# Patient Record
Sex: Female | Born: 1975 | Hispanic: Yes | Marital: Married | Smoking: Never smoker
Health system: Southern US, Community
[De-identification: ages and names within clinical notes are randomized; demographics above are authoritative.]

## PROBLEM LIST (undated history)

## (undated) DIAGNOSIS — H209 Unspecified iridocyclitis: Secondary | ICD-10-CM

## (undated) DIAGNOSIS — E785 Hyperlipidemia, unspecified: Secondary | ICD-10-CM

## (undated) DIAGNOSIS — E079 Disorder of thyroid, unspecified: Secondary | ICD-10-CM

## (undated) HISTORY — DX: Hyperlipidemia, unspecified: E78.5

## (undated) HISTORY — DX: Unspecified iridocyclitis: H20.9

---

## 2001-01-16 ENCOUNTER — Encounter (INDEPENDENT_AMBULATORY_CARE_PROVIDER_SITE_OTHER): Payer: Self-pay | Admitting: *Deleted

## 2001-01-16 LAB — CONVERTED CEMR LAB

## 2001-01-21 ENCOUNTER — Encounter: Admission: RE | Admit: 2001-01-21 | Discharge: 2001-01-21 | Payer: Self-pay | Admitting: Family Medicine

## 2002-09-05 ENCOUNTER — Emergency Department (HOSPITAL_COMMUNITY): Admission: EM | Admit: 2002-09-05 | Discharge: 2002-09-05 | Payer: Self-pay | Admitting: Emergency Medicine

## 2002-09-16 ENCOUNTER — Encounter: Payer: Self-pay | Admitting: Internal Medicine

## 2002-09-16 ENCOUNTER — Ambulatory Visit (HOSPITAL_COMMUNITY): Admission: RE | Admit: 2002-09-16 | Discharge: 2002-09-16 | Payer: Self-pay | Admitting: Internal Medicine

## 2003-01-01 ENCOUNTER — Encounter: Admission: RE | Admit: 2003-01-01 | Discharge: 2003-01-01 | Payer: Self-pay | Admitting: Family Medicine

## 2003-05-21 ENCOUNTER — Encounter: Admission: RE | Admit: 2003-05-21 | Discharge: 2003-05-21 | Payer: Self-pay | Admitting: Family Medicine

## 2004-08-25 ENCOUNTER — Other Ambulatory Visit: Admission: RE | Admit: 2004-08-25 | Discharge: 2004-08-25 | Payer: Self-pay | Admitting: Family Medicine

## 2005-01-06 ENCOUNTER — Encounter: Admission: RE | Admit: 2005-01-06 | Discharge: 2005-01-06 | Payer: Self-pay | Admitting: Family Medicine

## 2006-02-13 ENCOUNTER — Ambulatory Visit: Payer: Self-pay | Admitting: Internal Medicine

## 2006-02-13 LAB — CONVERTED CEMR LAB
ALT: 21 units/L (ref 0–40)
AST: 20 units/L (ref 0–37)
Albumin: 4 g/dL (ref 3.5–5.2)
Alkaline Phosphatase: 49 units/L (ref 39–117)
BUN: 18 mg/dL (ref 6–23)
Basophils Absolute: 0 10*3/uL (ref 0.0–0.1)
Basophils Relative: 0.2 % (ref 0.0–1.0)
Bilirubin, Direct: 0.1 mg/dL (ref 0.0–0.3)
CO2: 26 meq/L (ref 19–32)
Calcium: 9.2 mg/dL (ref 8.4–10.5)
Chloride: 102 meq/L (ref 96–112)
Creatinine, Ser: 0.6 mg/dL (ref 0.4–1.2)
Eosinophils Absolute: 0.1 10*3/uL (ref 0.0–0.6)
Eosinophils Relative: 1.5 % (ref 0.0–5.0)
GFR calc Af Amer: 151 mL/min
GFR calc non Af Amer: 125 mL/min
Glucose, Bld: 91 mg/dL (ref 70–99)
HCT: 41.6 % (ref 36.0–46.0)
Hemoglobin: 14.5 g/dL (ref 12.0–15.0)
Lymphocytes Relative: 25.4 % (ref 12.0–46.0)
MCHC: 34.8 g/dL (ref 30.0–36.0)
MCV: 92.5 fL (ref 78.0–100.0)
Monocytes Absolute: 0.2 10*3/uL (ref 0.2–0.7)
Monocytes Relative: 2.4 % — ABNORMAL LOW (ref 3.0–11.0)
Neutro Abs: 5.5 10*3/uL (ref 1.4–7.7)
Neutrophils Relative %: 70.5 % (ref 43.0–77.0)
Platelets: 249 10*3/uL (ref 150–400)
Potassium: 3.6 meq/L (ref 3.5–5.1)
RBC: 4.49 M/uL (ref 3.87–5.11)
RDW: 12 % (ref 11.5–14.6)
Sodium: 136 meq/L (ref 135–145)
Total Bilirubin: 0.8 mg/dL (ref 0.3–1.2)
Total Protein: 7.6 g/dL (ref 6.0–8.3)
WBC: 7.8 10*3/uL (ref 4.5–10.5)

## 2006-03-16 ENCOUNTER — Encounter (INDEPENDENT_AMBULATORY_CARE_PROVIDER_SITE_OTHER): Payer: Self-pay | Admitting: *Deleted

## 2006-11-21 ENCOUNTER — Other Ambulatory Visit: Admission: RE | Admit: 2006-11-21 | Discharge: 2006-11-21 | Payer: Self-pay | Admitting: Gynecology

## 2009-04-09 ENCOUNTER — Inpatient Hospital Stay (HOSPITAL_COMMUNITY): Admission: AD | Admit: 2009-04-09 | Discharge: 2009-04-09 | Payer: Self-pay | Admitting: Obstetrics

## 2009-05-15 ENCOUNTER — Inpatient Hospital Stay (HOSPITAL_COMMUNITY): Admission: AD | Admit: 2009-05-15 | Discharge: 2009-05-16 | Payer: Self-pay | Admitting: Obstetrics

## 2009-05-15 ENCOUNTER — Ambulatory Visit: Payer: Self-pay | Admitting: Advanced Practice Midwife

## 2009-06-18 ENCOUNTER — Inpatient Hospital Stay (HOSPITAL_COMMUNITY): Admission: AD | Admit: 2009-06-18 | Discharge: 2009-06-18 | Payer: Self-pay | Admitting: Obstetrics

## 2009-06-19 ENCOUNTER — Inpatient Hospital Stay (HOSPITAL_COMMUNITY): Admission: AD | Admit: 2009-06-19 | Discharge: 2009-06-23 | Payer: Self-pay | Admitting: Obstetrics

## 2010-04-04 LAB — URINE MICROSCOPIC-ADD ON

## 2010-04-04 LAB — CBC
HCT: 24.3 % — ABNORMAL LOW (ref 36.0–46.0)
HCT: 34.9 % — ABNORMAL LOW (ref 36.0–46.0)
Hemoglobin: 12.2 g/dL (ref 12.0–15.0)
Hemoglobin: 8.6 g/dL — ABNORMAL LOW (ref 12.0–15.0)
MCHC: 34.8 g/dL (ref 30.0–36.0)
MCHC: 35.3 g/dL (ref 30.0–36.0)
MCV: 96.5 fL (ref 78.0–100.0)
MCV: 97.5 fL (ref 78.0–100.0)
Platelets: 150 10*3/uL (ref 150–400)
Platelets: 202 10*3/uL (ref 150–400)
RBC: 2.49 MIL/uL — ABNORMAL LOW (ref 3.87–5.11)
RBC: 3.62 MIL/uL — ABNORMAL LOW (ref 3.87–5.11)
RDW: 14.8 % (ref 11.5–15.5)
RDW: 14.8 % (ref 11.5–15.5)
WBC: 18.7 10*3/uL — ABNORMAL HIGH (ref 4.0–10.5)
WBC: 9.9 10*3/uL (ref 4.0–10.5)

## 2010-04-04 LAB — URINALYSIS, ROUTINE W REFLEX MICROSCOPIC
Bilirubin Urine: NEGATIVE
Glucose, UA: NEGATIVE mg/dL
Ketones, ur: NEGATIVE mg/dL
Nitrite: NEGATIVE
Protein, ur: NEGATIVE mg/dL
Specific Gravity, Urine: <1.005 — ABNORMAL LOW (ref 1.005–1.030)
Urobilinogen, UA: 0.2 mg/dL (ref 0.0–1.0)
pH: 6 (ref 5.0–8.0)

## 2010-04-04 LAB — RPR: RPR Ser Ql: NONREACTIVE

## 2010-04-11 LAB — WET PREP, GENITAL
Trich, Wet Prep: NONE SEEN
Yeast Wet Prep HPF POC: NONE SEEN

## 2010-04-11 LAB — URINALYSIS, ROUTINE W REFLEX MICROSCOPIC
Bilirubin Urine: NEGATIVE
Glucose, UA: NEGATIVE mg/dL
Ketones, ur: NEGATIVE mg/dL
Leukocytes, UA: NEGATIVE
Nitrite: NEGATIVE
Protein, ur: NEGATIVE mg/dL
Specific Gravity, Urine: <1.005 — ABNORMAL LOW (ref 1.005–1.030)
Urobilinogen, UA: 0.2 mg/dL (ref 0.0–1.0)
pH: 6.5 (ref 5.0–8.0)

## 2010-04-11 LAB — URINE MICROSCOPIC-ADD ON

## 2011-06-16 ENCOUNTER — Encounter: Payer: Self-pay | Admitting: *Deleted

## 2011-06-16 ENCOUNTER — Ambulatory Visit (INDEPENDENT_AMBULATORY_CARE_PROVIDER_SITE_OTHER): Payer: Self-pay | Admitting: *Deleted

## 2011-06-16 ENCOUNTER — Other Ambulatory Visit: Payer: Self-pay | Admitting: Obstetrics and Gynecology

## 2011-06-16 VITALS — BP 112/74 | HR 63 | Temp 97.2°F | Ht 59.25 in | Wt 129.9 lb

## 2011-06-16 DIAGNOSIS — N6323 Unspecified lump in the left breast, lower outer quadrant: Secondary | ICD-10-CM

## 2011-06-16 DIAGNOSIS — N63 Unspecified lump in unspecified breast: Secondary | ICD-10-CM

## 2011-06-16 DIAGNOSIS — Z01419 Encounter for gynecological examination (general) (routine) without abnormal findings: Secondary | ICD-10-CM

## 2011-06-16 DIAGNOSIS — N6315 Unspecified lump in the right breast, overlapping quadrants: Secondary | ICD-10-CM

## 2011-06-16 DIAGNOSIS — N6311 Unspecified lump in the right breast, upper outer quadrant: Secondary | ICD-10-CM

## 2011-06-16 DIAGNOSIS — N6313 Unspecified lump in the right breast, lower outer quadrant: Secondary | ICD-10-CM

## 2011-06-16 DIAGNOSIS — N6324 Unspecified lump in the left breast, lower inner quadrant: Secondary | ICD-10-CM

## 2011-06-16 NOTE — Patient Instructions (Signed)
Taught patient how to perform BSE. Let her know [facility] will cover Pap smears every 3 years unless has a history of abnormal Pap smears. Patient referred to the [facility] for bilateral diagnostic mammogram. Appointment scheduled for Friday, June 23, 2011 at 1000.  Patient aware of appointment and will be there. Let patient know will follow up with her within the next couple weeks with results. Patient verbalized understanding.

## 2011-06-16 NOTE — Progress Notes (Signed)
Complaints of multiple lumps bilateral breasts.  Pap Smear:    Completed Pap smear today. Per patient thinks last pap smear was 2 years ago and normal. Per patient has no history of abnormal Pap smears. No Pap smear results in EPIC.  Physical exam: Breasts Breasts symmetrical. No skin abnormalities bilateral breasts. No nipple retraction bilateral breasts. No nipple discharge bilateral breasts. No lymphadenopathy. Palpated 4 lumps in right breast. Palpated lumps at 3 o'clock 2 cm from the areola, 7 o'clock 3 cm from the areola, 10 o'clock 2 cm from the areola and 11 o'clock 5 cm from the areola. Palpated two lumps in the left breast at 5 o'clock 1 cm from the areola and 8 o'clock 3 cm from the areola. Complaints of tenderness when palpated the lump in the left breast at 5 o'clock. Patient referred to the [facility] for bilateral diagnostic mammogram. Appointment scheduled for Friday, June 23, 2011 at 1000.        Pelvic/Bimanual   Ext Genitalia No lesions, no swelling and no discharge observed on external genitalia.         Vagina Vagina pink and normal texture. No lesions or discharge observed in vagina.          Cervix Cervix is present. Cervix pink and of normal texture. Small amount of blood observed at cervical os. Per patient end of menstrual cycle.     Uterus Uterus is present and palpable. Uterus in normal position and normal size.       Adnexae Bilateral ovaries present and palpable. No tenderness on palpation.        Rectovaginal No rectal exam completed today since patient had no rectal complaints. No skin abnormalities observed on exam

## 2011-06-23 ENCOUNTER — Ambulatory Visit
Admission: RE | Admit: 2011-06-23 | Discharge: 2011-06-23 | Disposition: A | Discharge status: Home / Self Care | Payer: No Typology Code available for payment source | Source: Ambulatory Visit | Attending: Obstetrics and Gynecology | Admitting: Obstetrics and Gynecology

## 2011-06-23 DIAGNOSIS — N63 Unspecified lump in unspecified breast: Secondary | ICD-10-CM

## 2011-07-03 ENCOUNTER — Telehealth: Payer: Self-pay | Admitting: *Deleted

## 2011-07-03 NOTE — Telephone Encounter (Signed)
Used interpreter Delorise Royals. Telephoned patient at home # and advised of normal pap results and negative diagnostic mammogram. Recommendation is screening mammogram at age 36. Patient voiced understanding.

## 2011-12-17 DIAGNOSIS — H209 Unspecified iridocyclitis: Secondary | ICD-10-CM

## 2011-12-17 HISTORY — DX: Unspecified iridocyclitis: H20.9

## 2012-02-02 ENCOUNTER — Encounter (HOSPITAL_COMMUNITY): Payer: Self-pay

## 2012-02-02 ENCOUNTER — Emergency Department (HOSPITAL_COMMUNITY)
Admission: EM | Admit: 2012-02-02 | Discharge: 2012-02-02 | Disposition: A | Discharge status: Home / Self Care | Payer: Self-pay | Attending: Emergency Medicine | Admitting: Emergency Medicine

## 2012-02-02 DIAGNOSIS — J029 Acute pharyngitis, unspecified: Secondary | ICD-10-CM | POA: Insufficient documentation

## 2012-02-02 DIAGNOSIS — Z79899 Other long term (current) drug therapy: Secondary | ICD-10-CM | POA: Insufficient documentation

## 2012-02-02 DIAGNOSIS — K1370 Unspecified lesions of oral mucosa: Secondary | ICD-10-CM | POA: Insufficient documentation

## 2012-02-02 DIAGNOSIS — R51 Headache: Secondary | ICD-10-CM | POA: Insufficient documentation

## 2012-02-02 DIAGNOSIS — R1319 Other dysphagia: Secondary | ICD-10-CM | POA: Insufficient documentation

## 2012-02-02 DIAGNOSIS — R42 Dizziness and giddiness: Secondary | ICD-10-CM | POA: Insufficient documentation

## 2012-02-02 DIAGNOSIS — M542 Cervicalgia: Secondary | ICD-10-CM | POA: Insufficient documentation

## 2012-02-02 LAB — RAPID STREP SCREEN (MED CTR MEBANE ONLY): Streptococcus, Group A Screen (Direct): NEGATIVE

## 2012-02-02 MED ORDER — ACYCLOVIR 400 MG PO TABS: 800.0000 mg | ORAL_TABLET | Freq: Every day | ORAL | Status: DC

## 2012-02-02 MED ORDER — HYDROCODONE-ACETAMINOPHEN 5-325 MG PO TABS: 1.0000 | ORAL_TABLET | ORAL | Status: DC | PRN

## 2012-02-02 NOTE — ED Provider Notes (Signed)
History   This chart was scribed for non-physician practitioner working with Carleene Cooper III, MD by Frederik Pear, ED Scribe. This patient was seen in room TR05C/TR05C and the patient's care was started at 2026.   CSN: [id number]  Arrival date & time 02/02/12  1704   First MD Initiated Contact with Patient 02/02/12 2026      Chief Complaint  Patient presents with  . Sore Throat    (Consider location/radiation/quality/duration/timing/severity/associated sxs/prior treatment) The history is provided by the patient, the spouse and medical records.    Sonya Gregory is a 37 y.o. female who presents to the Emergency Department complaining of a constant, moderate, sudden onset sore throat with difficulty swallowing and headache that radiates into her neck and back and began 3 weeks ago after she had oral sex with her husband. She reports that she saw a provider 2 weeks ago and was prescribed ibuprofen, azithromycin, Diflucan and Cipro, but states that the pain has not improved with treatment. She denies knowing her diagnosis. She states that she was diagnosed with iritis 6 months ago, but has resolved since she treated it. She denies any fever or chills.  Husband denies lesions on his penis initially but upon further questioning states that he does have a wart on his penis.  He also denies discharge or dysuria.   History reviewed. No pertinent past medical history.  Past Surgical History  Procedure Date  . Cesarean section     Family History  Problem Relation Age of Onset  . Hypertension Paternal Grandmother   . Diabetes Father   . Breast cancer Paternal Aunt   . Breast cancer Paternal Aunt     History  Substance Use Topics  . Smoking status: Never Smoker   . Smokeless tobacco: Never Used  . Alcohol Use: No    OB History    Grav Para Term Preterm Abortions TAB SAB Ect Mult Living   1         1      Review of Systems  Constitutional: Negative for fever, diaphoresis,  appetite change, fatigue and unexpected weight change.  HENT: Positive for sore throat, trouble swallowing and neck pain. Negative for mouth sores and neck stiffness.   Eyes: Negative for visual disturbance.  Respiratory: Negative for cough, chest tightness and wheezing.   Gastrointestinal: Negative for nausea, vomiting, diarrhea and constipation.  Genitourinary: Negative for dysuria, urgency, frequency and hematuria.  Musculoskeletal: Negative for back pain.  Skin: Negative for rash.  Neurological: Positive for headaches. Negative for syncope and light-headedness.  Hematological: Does not bruise/bleed easily.  Psychiatric/Behavioral: Negative for sleep disturbance. The patient is not nervous/anxious.   All other systems reviewed and are negative.    Allergies  Review of patient's allergies indicates no known allergies.  Home Medications   Current Outpatient Rx  Name  Route  Sig  Dispense  Refill  . CIPROFLOXACIN HCL 500 MG PO TABS   Oral   Take 500 mg by mouth 2 (two) times daily. For 10 days; Start date 01/31/12         . FLUCONAZOLE 150 MG PO TABS   Oral   Take 150 mg by mouth daily as needed. For infection         . IBUPROFEN 600 MG PO TABS   Oral   Take 600 mg by mouth 2 (two) times daily.         Marland Kitchen POLYETHYL GLYCOL-PROPYL GLYCOL 0.4-0.3 % OP SOLN   Both Eyes  Place 4 drops into both eyes 4 (four) times daily.         Marland Kitchen PREDNISOLONE ACETATE 1 % OP SUSP   Left Eye   Place 1 drop into the left eye 2 (two) times daily.         . ACYCLOVIR 400 MG PO TABS   Oral   Take 2 tablets (800 mg total) by mouth 5 (five) times daily.   50 tablet   0   . HYDROCODONE-ACETAMINOPHEN 5-325 MG PO TABS   Oral   Take 1 tablet by mouth every 4 (four) hours as needed for pain.   10 tablet   0     BP 130/70  Pulse 78  Temp 97.3 F (36.3 C) (Oral)  Resp 16  SpO2 100%  LMP 01/29/2012  Physical Exam  Nursing note and vitals reviewed. Constitutional: She is oriented  to person, place, and time. She appears well-developed and well-nourished. No distress.  HENT:  Head: Normocephalic and atraumatic.  Right Ear: Tympanic membrane, external ear and ear canal normal.  Left Ear: Tympanic membrane, external ear and ear canal normal.  Nose: Nose normal. No rhinorrhea.  Mouth/Throat: Uvula is midline and mucous membranes are normal. Posterior oropharyngeal erythema present. No oropharyngeal exudate, posterior oropharyngeal edema or tonsillar abscesses.       On the soft palate, there are papular lesions with light brown coloring, but no petechiae or purpura. There are no abyssal ulcers or exudates. Her pharynx is erytheminous without exudates or edema. There are no plaques in the oropharynx.    Eyes: Conjunctivae normal and EOM are normal. Pupils are equal, round, and reactive to light. No scleral icterus.  Neck: Normal range of motion. Neck supple.  Cardiovascular: Normal rate, regular rhythm, normal heart sounds and intact distal pulses.  Exam reveals no gallop and no friction rub.   No murmur heard. Pulmonary/Chest: Effort normal and breath sounds normal. No respiratory distress. She has no wheezes. She has no rales. She exhibits no tenderness.  Abdominal: Soft. Bowel sounds are normal. She exhibits no distension and no mass. There is no tenderness. There is no rebound and no guarding.  Musculoskeletal: Normal range of motion. She exhibits no edema and no tenderness.  Lymphadenopathy:       Head (right side): No submental, no submandibular, no tonsillar, no preauricular, no posterior auricular and no occipital adenopathy present.       Head (left side): No submental, no submandibular, no tonsillar, no preauricular, no posterior auricular and no occipital adenopathy present.    She has cervical adenopathy.       Right cervical: No superficial cervical, no deep cervical and no posterior cervical adenopathy present.      Left cervical: No superficial cervical, no deep  cervical and no posterior cervical adenopathy present.    She has no axillary adenopathy.  Neurological: She is alert and oriented to person, place, and time. She exhibits normal muscle tone. Coordination normal.       Speech is clear and goal oriented Moves extremities without ataxia  Skin: Skin is warm and dry. No rash noted. She is not diaphoretic. No erythema.  Psychiatric: She has a normal mood and affect.    ED Course  Procedures (including critical care time)  DIAGNOSTIC STUDIES: Oxygen Saturation is 100% on room air, normal by my interpretation.    COORDINATION OF CARE:  20:38- Discussed planned course of treatment with the patient, including an injection of antibiotics, who is agreeable at  this time.    Labs Reviewed  RAPID STREP SCREEN  CHLAMYDIA CULTURE  HERPES SIMPLEX VIRUS CULTURE   No results found.   1. Oral mucosal lesion   2. Pharyngitis       MDM  Candace Begue presents with oral lesions of unknown origin.  Concern for gonorrhea or chlamydia since these occurred after oral sex. Wartlike in nature concern also exists for HPV.  Viral cultures obtained for Chlamydia, HPV and HSV.  Strep test negative.  Will treat with acyclovir and have her followup with her primary care physician this week. I have also discussed reasons to return immediately to the ER.  Patient expresses understanding and agrees with plan.  Dr. Ignacia Palma was consulted, evaluated this patient with me and agrees with the plan.     1. Medications: Vicodin, acyclovir, usual home medications  2. Treatment: rest, drink plenty of fluids, take medications as prescribed including your antibiotics and your new medications  3. Follow Up: Please followup with your primary doctor for discussion of your diagnoses and further evaluation after today's visit; if you do not have a primary care doctor use the resource guide provided to find one;   I personally performed the services described in this  documentation, which was scribed in my presence. The recorded information has been reviewed and is accurate.       Dahlia Client Darcey Cardy, PA-C 02/03/12 0045

## 2012-02-02 NOTE — ED Notes (Addendum)
Sore throat, neck pain and dizziness for 1 week .  Pt. Is worried about  Small dark marks on her top palette.  Pt. Reports that when she swallows she feels like something is stuck in her throat.

## 2012-02-03 NOTE — ED Provider Notes (Signed)
Medical screening examination/treatment/procedure(s) were conducted as a shared visit with non-physician practitioner(s) and myself.  I personally evaluated the patient during the encounter Pt with tiny vesicles on soft palate after exposure by oral sex.  Suspect herpes.  Rx acyclovir.  Carleene Cooper III, MD 02/03/12 1130

## 2012-02-05 ENCOUNTER — Ambulatory Visit (INDEPENDENT_AMBULATORY_CARE_PROVIDER_SITE_OTHER): Payer: Self-pay | Admitting: Internal Medicine

## 2012-02-05 ENCOUNTER — Encounter: Payer: Self-pay | Admitting: Internal Medicine

## 2012-02-05 VITALS — BP 104/70 | HR 75 | Temp 98.1°F | Wt 128.0 lb

## 2012-02-05 DIAGNOSIS — Z113 Encounter for screening for infections with a predominantly sexual mode of transmission: Secondary | ICD-10-CM

## 2012-02-05 DIAGNOSIS — J029 Acute pharyngitis, unspecified: Secondary | ICD-10-CM | POA: Insufficient documentation

## 2012-02-05 DIAGNOSIS — H209 Unspecified iridocyclitis: Secondary | ICD-10-CM | POA: Insufficient documentation

## 2012-02-05 LAB — HERPES SIMPLEX VIRUS CULTURE
Culture: NOT DETECTED
Special Requests: NORMAL

## 2012-02-05 MED ORDER — CEFIXIME 400 MG PO CAPS: 1.0000 | ORAL_CAPSULE | Freq: Every day | ORAL | Status: DC

## 2012-02-05 NOTE — Progress Notes (Signed)
  Subjective:    Patient ID: Sonya Gregory, female    DOB: 06/19/1975, 37 y.o.   MRN: [id number]  HPI New patient Patient presents with the following  history: Developed a sore throat after oral sex with her husband who has genital warts. Was seen by a provider elsewhere who prescribed Zithromax, Diflucan and Cipro ~ 1-10-`4. Apparently no w/u was done Went to the ER 02/02/2012 with persistent sore throat, HA and difficulty swallowing. A strep test was negative, Chlamydia and HPV- HSV cultures are pending. Was rec to cont taking the same meds plus acyclovir and hydrocodone (which she has not needed) Patient reports that her husband has been checked by a physician today.  Past Medical History  Diagnosis Date  . 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 Cesarean section    History   Social History  . Marital Status: Married    Spouse Name: N/A    Number of Children: 1  . 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 Smoking status: Never Smoker   . Smokeless tobacco: Never Used  . Alcohol Use: No  . Drug Use: No  . Sexually Active: Yes    Birth Control/ Protection: None   Other Topics Concern  . Not on file   Social History Narrative   Original from [place]   Family History  Problem Relation Age of Onset  . Hypertension Paternal Grandmother   . Diabetes Father   . Breast cancer Paternal Aunt   . Breast cancer Paternal Aunt   . Colon cancer Neg Hx   . CAD Neg Hx      Review of Systems At this point, she feels better. Denies fever or chills. No skin rashes of any type. Past history of herpes labialis but no recent outbreaks, no oral ulcers but developed  few bumps last week at the soft palate. Denies any nausea, vomiting, diarrhea. No vaginal discharge. Not on birth control, I'm concerned because she's taking Cipro but she reports she had a normal period  last week and she has not been sexually active since. Interestingly, she was diagnosed with iritis few weeks ago and is taking eyedrops.     Objective:   Physical Exam General -- alert, well-developed, healthy-appearing and well-nourished.   Neck --  no LADs HEENT -- TMs normal, throat w/o redness, soft palpate has less than 1 mm in size bumps, slightly hyperpigmented, no clear-cut blisters. Lungs -- normal respiratory effort, no intercostal retractions, no accessory muscle use, and normal breath sounds.   Heart-- normal rate, regular rhythm, no murmur, and no gallop.   Extremities-- no pretibial edema bilaterally Psych-- Cognition and judgment appear intact. Alert and cooperative with normal attention span and concentration.  not anxious appearing and not depressed appearing.       Assessment & Plan:  Today , I spent more than 30 min with the patient, >50% of the time counseling, and /or reviewing the chart and labs ordered by other providers

## 2012-02-05 NOTE — Assessment & Plan Note (Signed)
Clinical picture consistent with pharyngitis, could be herpetic or STD related. She is currently taking ciprofloxacin which covers  Chlamydia. Was prescribed Zithromax 4 tablets but took them one a day (not together)at a consequently she had no good coverage for gonorrhea. Plan: HIV, VDRL cefexime 400 x 1  Cultures pending, see history of present illness. She is already feeling better, her husband has been checked by another physician consequently I think this is all she needs for now. I asked her to come back in 3 months.

## 2012-02-05 NOTE — Assessment & Plan Note (Signed)
Recently diagnosed with iritis, sees ophthalmology, recommend to keep appointments with them. The patient brought a number of labs don't 01/25/2011, CBC, BMP, LFTs, cholesterol panel and TSH were all normal.

## 2012-02-07 LAB — CHLAMYDIA CULTURE: Special Requests: NORMAL

## 2012-02-08 ENCOUNTER — Encounter: Payer: Self-pay | Admitting: *Deleted

## 2012-02-16 ENCOUNTER — Ambulatory Visit (INDEPENDENT_AMBULATORY_CARE_PROVIDER_SITE_OTHER): Payer: Self-pay | Admitting: Internal Medicine

## 2012-02-16 ENCOUNTER — Encounter: Payer: Self-pay | Admitting: Internal Medicine

## 2012-02-16 VITALS — BP 116/70 | HR 72 | Temp 97.9°F | Wt 128.0 lb

## 2012-02-16 DIAGNOSIS — N760 Acute vaginitis: Secondary | ICD-10-CM

## 2012-02-16 DIAGNOSIS — J029 Acute pharyngitis, unspecified: Secondary | ICD-10-CM

## 2012-02-16 DIAGNOSIS — H209 Unspecified iridocyclitis: Secondary | ICD-10-CM

## 2012-02-16 DIAGNOSIS — R51 Headache: Secondary | ICD-10-CM

## 2012-02-16 NOTE — Progress Notes (Signed)
  Subjective:    Patient ID: Sonya Gregory, female    DOB: Jan 31, 1975, 37 y.o.   MRN: [id number]  HPI Acute visit Was seen recently with pharyngitis, took her medications as recommended. The sore throat is better but not completely well. She also developed a number of other symptoms, occasional headache and dizziness. Headaches are similar to previous headaches. Dizziness is now resolved. She also has occasional pain in the elbows and wrists. 3 days ago developed some mild vaginal itching and a very mild yellow discharge.  Past Medical History  Diagnosis Date  . 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 Cesarean section     Review of Systems Denies fever or chills. Denies any rash in general and specifically no genital rash. Denies any swelling in the joints. No myalgias per se Iritis symptoms improving      Objective:   Physical Exam General -- alert, well-developed, NAD.   HEENT -- TMs normal, throat w/o redness, soft palpate has less than 1 mm in size bumps, slightly hyperpigmented Extremities-- no pretibial edema bilaterally; inspection and palpation of the elbows and wrists without synovitis  Neurologic-- alert & oriented X3; speech gait and motor are intact. DTRs symmetric. Neck is full range of motion. EOMI. Psych-- Cognition and judgment appear intact. Alert and cooperative with normal attention span and concentration.  Some anxiety, no depression.     Assessment & Plan:   Vaginitis, Vaginal itching and mild discharge off antibiotics, recommend Monistat.  HAs, Similar to previous headaches, neurological exam normal, she is feeling better . Recommend observation.

## 2012-02-16 NOTE — Patient Instructions (Addendum)
Use Monistat OTC vaginal cream x 1 week Return in 3 months

## 2012-02-18 ENCOUNTER — Encounter: Payer: Self-pay | Admitting: Internal Medicine

## 2012-02-18 NOTE — Assessment & Plan Note (Signed)
Pharyngitis, See previous entry, test for chlamydia and herpes were negative, status post empiric treatment for gonorrhea and chlamydia. She continue with mild pain, she's also concerned about the bumps in the soft palate. She has a number of questions, I suggested observation at this point since there is no evidence of infection but she is still very apprehensive about it despite my reassurance Plan: ENT referral to be sure we are not missing anything.

## 2012-02-18 NOTE — Assessment & Plan Note (Signed)
Iritis. Currently under the care of ophthalmology, interestingly she has a family history of rheumatoid arthritis and is having some aches and pains. I asked her to come back in 3 months, to be sure she  is not developing an inflammatory arthritis.

## 2012-05-10 ENCOUNTER — Ambulatory Visit: Payer: Self-pay | Admitting: Internal Medicine

## 2013-08-28 ENCOUNTER — Other Ambulatory Visit: Payer: Self-pay | Admitting: Otolaryngology

## 2013-08-28 DIAGNOSIS — R070 Pain in throat: Secondary | ICD-10-CM

## 2013-09-04 ENCOUNTER — Ambulatory Visit
Admission: RE | Admit: 2013-09-04 | Discharge: 2013-09-04 | Disposition: A | Discharge status: Home / Self Care | Payer: No Typology Code available for payment source | Source: Ambulatory Visit | Attending: Otolaryngology | Admitting: Otolaryngology

## 2013-09-04 DIAGNOSIS — R070 Pain in throat: Secondary | ICD-10-CM

## 2013-09-04 MED ORDER — IOHEXOL 300 MG/ML  SOLN
75.0000 mL | Freq: Once | INTRAMUSCULAR | Status: AC | PRN
  Administered 2013-09-04: 75 mL via INTRAVENOUS

## 2013-11-17 ENCOUNTER — Encounter: Payer: Self-pay | Admitting: Internal Medicine

## 2016-03-02 ENCOUNTER — Other Ambulatory Visit: Payer: Self-pay | Admitting: Obstetrics and Gynecology

## 2016-03-02 DIAGNOSIS — Z1231 Encounter for screening mammogram for malignant neoplasm of breast: Secondary | ICD-10-CM

## 2016-03-16 ENCOUNTER — Encounter (HOSPITAL_COMMUNITY): Payer: Self-pay

## 2016-03-16 ENCOUNTER — Ambulatory Visit
Admission: RE | Admit: 2016-03-16 | Discharge: 2016-03-16 | Disposition: A | Discharge status: Home / Self Care | Payer: No Typology Code available for payment source | Source: Ambulatory Visit | Attending: Obstetrics and Gynecology | Admitting: Obstetrics and Gynecology

## 2016-03-16 ENCOUNTER — Ambulatory Visit (HOSPITAL_COMMUNITY)
Admission: RE | Admit: 2016-03-16 | Discharge: 2016-03-16 | Disposition: A | Discharge status: Home / Self Care | Payer: Self-pay | Source: Ambulatory Visit | Attending: Obstetrics and Gynecology | Admitting: Obstetrics and Gynecology

## 2016-03-16 VITALS — BP 104/60 | Temp 98.3°F | Ht 60.5 in | Wt 145.2 lb

## 2016-03-16 DIAGNOSIS — Z1231 Encounter for screening mammogram for malignant neoplasm of breast: Secondary | ICD-10-CM

## 2016-03-16 DIAGNOSIS — Z1239 Encounter for other screening for malignant neoplasm of breast: Secondary | ICD-10-CM

## 2016-03-16 NOTE — Patient Instructions (Signed)
Explained breast self awareness with Sonya Gregory. Unable to complete Pap smear today due to patient is currently on her menstrual cycle. Scheduled patient appointment with [facility] on Thursday, March 30, 2016 at [id number] for a Pap smear. [facility]. Appointment scheduled for Thursday, March 16, 2016 at 1440. Let patient know the Breast Center will follow up with her within the next couple weeks with results of mammogram by letter or phone. Sonya Gregory verbalized understanding.  Tyliek Timberman, Kathaleen Maserhristine Poll, RN 2:16 PM

## 2016-03-16 NOTE — Progress Notes (Signed)
No complaints today.   Pap Smear: Pap smear not completed today. [facility] and normal. Per patient has no history of an abnormal Pap smear. Unable to complete Pap smear today due to patient is currently on her menstrual cycle. Scheduled patient appointment with [facility] on Thursday, March 30, 2016 at [id number] for a Pap smear. Last Pap smear result is in EPIC.  Physical exam: Breasts Breasts symmetrical. No skin abnormalities bilateral breasts. No nipple retraction bilateral breasts. No nipple discharge bilateral breasts. No lymphadenopathy. No lumps palpated bilateral breasts. Complaints of bilateral outer breast tenderness when palpated. [facility]. Appointment scheduled for Thursday, March 16, 2016 at 1440.       Pelvic/Bimanual No Pap smear completed today since patient is currently on menstrual cycle.  Smoking History: Patient has never smoked.  Patient Navigation: Patient education provided. Access [facility] services provided for [facility]. Spanish interpreter provided.  Used Spanish interpreter [employer] CompanyBlanca Lindner from [facility].

## 2016-03-17 ENCOUNTER — Encounter (HOSPITAL_COMMUNITY): Payer: Self-pay | Admitting: *Deleted

## 2016-03-30 ENCOUNTER — Ambulatory Visit (HOSPITAL_COMMUNITY)
Admission: RE | Admit: 2016-03-30 | Discharge: 2016-03-30 | Disposition: A | Discharge status: Home / Self Care | Payer: Self-pay | Source: Ambulatory Visit | Attending: Obstetrics and Gynecology | Admitting: Obstetrics and Gynecology

## 2016-03-30 ENCOUNTER — Encounter (HOSPITAL_COMMUNITY): Payer: Self-pay

## 2016-03-30 VITALS — BP 114/76 | Temp 98.1°F | Ht 60.5 in

## 2016-03-30 DIAGNOSIS — Z01419 Encounter for gynecological examination (general) (routine) without abnormal findings: Secondary | ICD-10-CM

## 2016-03-30 NOTE — Progress Notes (Signed)
No complaints today.   Pap Smear: Pap smear completed today. Last Pap smear was 06/16/2011 in [facility] clinic and normal. Per patient has no history of an abnormal Pap smear. Last Pap smear results are in EPIC.  Pelvic/Bimanual   Ext Genitalia No lesions, no swelling and no discharge observed on external genitalia.         Vagina Vagina pink and normal texture. No lesions or discharge observed in vagina.          Cervix Cervix is present. Cervix pink and of normal texture. No discharge observed.     Uterus Uterus is present and palpable. Uterus in normal position and normal size.        Adnexae Bilateral ovaries present and palpable. No tenderness on palpation.          Rectovaginal No rectal exam completed today since patient had no rectal complaints. No skin abnormalities observed on exam.    Smoking History: Patient has never smoked.  Patient Navigation: Patient education provided. [facility]. Spanish interpreter provided.  Used Spanish interpreter Earle GellMaria Gregory from [facility].

## 2016-03-30 NOTE — Patient Instructions (Signed)
Explained to [name] that if today's Pap smear and HPV typing is negative that her next Pap smear will be due in 5 years. Let patient know will follow up with her within the next couple weeks with results of Pap smear by phone. [name] verbalized understanding.  Ray Glacken, Kathaleen Maserhristine Poll, RN 10:50 AM

## 2016-04-01 LAB — CYTOLOGY - PAP
ADEQUACY: ABSENT
DIAGNOSIS: NEGATIVE
HPV (WINDOPATH): NOT DETECTED

## 2016-04-05 ENCOUNTER — Other Ambulatory Visit: Payer: Self-pay

## 2016-04-05 DIAGNOSIS — Z0000 Encounter for general adult medical examination without abnormal findings: Secondary | ICD-10-CM

## 2016-04-07 ENCOUNTER — Ambulatory Visit: Payer: Self-pay

## 2016-04-07 ENCOUNTER — Other Ambulatory Visit (HOSPITAL_BASED_OUTPATIENT_CLINIC_OR_DEPARTMENT_OTHER): Payer: Self-pay

## 2016-04-07 VITALS — BP 102/70 | Ht 62.0 in | Wt 143.0 lb

## 2016-04-07 DIAGNOSIS — Z0000 Encounter for general adult medical examination without abnormal findings: Secondary | ICD-10-CM

## 2016-04-07 LAB — GLUCOSE (CC13): GLUCOSE: 88 mg/dL (ref 70–140)

## 2016-04-07 NOTE — Progress Notes (Signed)
Wisewoman Initial Screening Patient referred to [employer].   Clinical Measurement: HT: WT: BP: 98/70 BPx2: 102/70, labs drawn today will review with patient when they result.   Medical History: Patient does not have a history of high cholesterol, HTN, or diabetes.   Medication: patient does not take any medication at this time  Blood Pressure, Self Measurement: not applicable   Nutrition: Patient eats 1 cups of vegetables daily, 1 cup of fruit daily. She does not eat fish or whole grains often. She drinks more than 36 ounces of sugary beverages weekly and does not watch her sodium intake.    Physical Activity: Patient states she has not done much physical activity, maybe 30 minutes total of moderate activity weekly.   Smoking Status: Patient does not smoke and does not ride in a vehicle with anyone who smokes.   Quality of Life: Patient has not had any mental/physical days where she was unable to perform her normal daily activities.   Risk Reduction and Counseling: Patients primary goal is to increase water intake, she will use her wisewoman measured water bottle to help with this goal. She will try to drink at least 3 bottles a day equalling 60 ounces. She wants to decrease the amount of sugary drinks she consumes and increase her vegetable intake as well But she will primarily focus on the water intake right now.   Navigation: Will call patient with lab results within a week along with second health coaching session. Pamphlets and wisewoman gifts given to patient at today's visit.

## 2016-04-08 LAB — LIPID PANEL
Chol/HDL Ratio: 3.3 ratio units (ref 0.0–4.4)
Cholesterol, Total: 176 mg/dL (ref 100–199)
HDL: 54 mg/dL (ref >39)
LDL Calculated: 106 mg/dL — ABNORMAL HIGH (ref 0–99)
Triglycerides: 78 mg/dL (ref 0–149)
VLDL Cholesterol Cal: 16 mg/dL (ref 5–40)

## 2016-04-08 LAB — HEMOGLOBIN A1C
Est. average glucose Bld gHb Est-mCnc: 114 mg/dL
Hemoglobin A1c: 5.6 % (ref 4.8–5.6)

## 2016-04-10 ENCOUNTER — Telehealth: Payer: Self-pay

## 2016-04-10 NOTE — Telephone Encounter (Signed)
Health Coaching #2  Initial Screening: 04/07/16  Interpretor: Sonya RoyalsJulie Sowell  Labs: Cholesterol: 176 HDL:54 LDL: 106 Triglycerides: 78 A1C: 5.6 Glucose: 88. Patient informed of lab results. No f/u required for these results.   Goal: Patients goal was to increase her water intake. She states she is using her wisewoman bottle and consuming 3 a day.She has also increased her daily walking. She will continue to stay on her water intake and track it to make sure she is consuming what she is supposed to. Encouraged pt to continue the good work   Navigation: Time spent with patient via phone: 10 Minutes, patient understands there will be another health coaching session in 2-3 weeks.

## 2016-04-17 ENCOUNTER — Encounter (HOSPITAL_COMMUNITY): Payer: Self-pay | Admitting: *Deleted

## 2016-04-17 NOTE — Progress Notes (Signed)
Letter mailed to patient concerning pap smear results.  

## 2016-04-19 ENCOUNTER — Telehealth (HOSPITAL_COMMUNITY): Payer: Self-pay

## 2016-04-19 ENCOUNTER — Telehealth (HOSPITAL_COMMUNITY): Payer: Self-pay | Admitting: *Deleted

## 2016-04-19 NOTE — Telephone Encounter (Signed)
Patient called wanting results to pap smear. Advised patient pap smear results were negative. HPV was negative. Next pap smear due in five years. Patient voiced understanding.

## 2016-04-19 NOTE — Telephone Encounter (Signed)
Health Coaching #3  Initial Screening:04/07/16  Interpretor:Julie Sowell  Goal:Patients goal is to increase her water intake. She has been doing this by using her wisewoman water bottle. She is consuming 3 bottles a day. Encouraged her to continue this.   Navigation: Time spent with patient via phone: 10 Minutes, patient understands there will be a final follow up call in 4- 6 weeks.

## 2016-10-25 ENCOUNTER — Telehealth (HOSPITAL_COMMUNITY): Payer: Self-pay

## 2016-12-13 NOTE — Telephone Encounter (Signed)
Left message regarding [facility]OMAN program. 

## 2016-12-15 ENCOUNTER — Telehealth (HOSPITAL_COMMUNITY): Payer: Self-pay

## 2016-12-15 NOTE — Telephone Encounter (Signed)
[facility]AN Follow Up  Interpreter Raynelle FanningJulie Sowell  Medication: Patient does not need to take medication for HTN, diabetes or hyperlipidemia.   Blood Pressure, Self Management: N/A  Nutrition Assessment: Patient eats 1/2 cup of fruit on average daily. She eats 1 1/2 cups of vegetables on average on a daily basis. Patient eats 2 servings of fish weekly. She eats 3 oz of whole grains daily. Patient drinks less than 36 oz of sugar added beverages weekly. She currently watches her sodium intake.  Physical Activity: Patient gets 30-60 minutes of moderate activity of weekly. She gets 30 minutes of vigorous activity weekly.  Smoking Status: Patient is a non-smoker.  Quality of Life: Patient states that she has had no bad mental or physical health days in the last 30 days.  Patient states that she would like [facility] she is [facility].

## 2016-12-18 ENCOUNTER — Encounter (HOSPITAL_COMMUNITY): Payer: Self-pay

## 2018-02-16 DIAGNOSIS — E039 Hypothyroidism, unspecified: Secondary | ICD-10-CM

## 2018-02-16 HISTORY — DX: Hypothyroidism, unspecified: E03.9

## 2018-08-12 ENCOUNTER — Other Ambulatory Visit (HOSPITAL_COMMUNITY): Payer: Self-pay | Admitting: *Deleted

## 2018-08-12 DIAGNOSIS — N6320 Unspecified lump in the left breast, unspecified quadrant: Secondary | ICD-10-CM

## 2018-08-13 ENCOUNTER — Other Ambulatory Visit: Payer: Self-pay

## 2018-08-13 ENCOUNTER — Encounter (HOSPITAL_COMMUNITY): Payer: Self-pay

## 2018-08-13 ENCOUNTER — Ambulatory Visit (HOSPITAL_COMMUNITY)
Admission: RE | Admit: 2018-08-13 | Discharge: 2018-08-13 | Disposition: A | Discharge status: Home / Self Care | Payer: No Typology Code available for payment source | Source: Ambulatory Visit | Attending: Obstetrics and Gynecology | Admitting: Obstetrics and Gynecology

## 2018-08-13 DIAGNOSIS — Z1239 Encounter for other screening for malignant neoplasm of breast: Secondary | ICD-10-CM | POA: Insufficient documentation

## 2018-08-13 DIAGNOSIS — N644 Mastodynia: Secondary | ICD-10-CM | POA: Insufficient documentation

## 2018-08-13 HISTORY — DX: Disorder of thyroid, unspecified: E07.9

## 2018-08-13 NOTE — Progress Notes (Signed)
Complaints of a left breast lump x 3 weeks that is painful. Patient states the pain is constant and rates at a 2-5 out of 10.  Pap Smear: Pap smear not completed today. Last Pap smear was 03/30/2016 at [facility] and normal with negative HPV. Per patient has no history of an abnormal Pap smear. Last two Pap smear results are in Epic.  Physical exam: Breasts Breasts symmetrical. No skin abnormalities bilateral breasts. No nipple retraction bilateral breasts. No nipple discharge bilateral breasts. No lymphadenopathy. No lumps palpated bilateral breasts. Unable to palpate a lump in patients area of concern. Complaints of bilateral outer breast and left lower breast pain on exam. Referred patient to the [facility] for a diagnostic mammogram. Appointment scheduled for Friday, August 16, 2018 at [place].        Pelvic/Bimanual No Pap smear completed today since last Pap smear and HPV typing was 03/30/2016. Pap smear not indicated per BCCCP guidelines.   Smoking History: Patient has never smoked.  Patient Navigation: Patient education provided. Access to services provided for patient through [facility] program. Spanish interpreter provided.   Breast and Cervical Cancer Risk Assessment: Patient has no family history of breast cancer, known genetic mutations, or radiation treatment to the chest before age 39. Patient has no history of cervical dysplasia, immunocompromised, or DES exposure in-utero.  Risk Assessment    Risk Scores      08/13/2018   Last edited by: Armond Hang, LPN   5-year risk: 0.7 %   Lifetime risk: 10.3 %         Used Spanish interpreter Rudene Anda from [place].

## 2018-08-13 NOTE — Patient Instructions (Signed)
Explained breast self awareness with Marcelline Mates. Patient did not need a Pap smear today due to last Pap smear and HPV typing was 03/30/2016. Let her know BCCCP will cover Pap smears and HPV typing every 5 years unless has a history of abnormal Pap smears. Referred patient to the [facility] for a diagnostic mammogram. Appointment scheduled for Friday, August 16, 2018 at [place]. Patient aware of appointment and will be there.  Marcelline Mates verbalized understanding  [place], Arvil Chaco, RN 9:00 AM

## 2018-08-15 ENCOUNTER — Encounter (HOSPITAL_COMMUNITY): Payer: Self-pay | Admitting: *Deleted

## 2018-08-16 ENCOUNTER — Ambulatory Visit
Admission: RE | Admit: 2018-08-16 | Discharge: 2018-08-16 | Disposition: A | Discharge status: Home / Self Care | Payer: No Typology Code available for payment source | Source: Ambulatory Visit | Attending: Obstetrics and Gynecology | Admitting: Obstetrics and Gynecology

## 2018-08-16 ENCOUNTER — Other Ambulatory Visit: Payer: Self-pay

## 2018-08-16 DIAGNOSIS — N6320 Unspecified lump in the left breast, unspecified quadrant: Secondary | ICD-10-CM

## 2018-09-12 ENCOUNTER — Ambulatory Visit (HOSPITAL_COMMUNITY): Payer: No Typology Code available for payment source

## 2018-09-13 ENCOUNTER — Ambulatory Visit (HOSPITAL_COMMUNITY): Payer: No Typology Code available for payment source

## 2018-09-13 ENCOUNTER — Other Ambulatory Visit: Payer: No Typology Code available for payment source

## 2019-01-23 ENCOUNTER — Encounter: Payer: Self-pay | Admitting: Obstetrics and Gynecology

## 2019-12-07 ENCOUNTER — Emergency Department (HOSPITAL_COMMUNITY)
Admission: EM | Admit: 2019-12-07 | Discharge: 2019-12-08 | Disposition: A | Discharge status: Home / Self Care | Payer: Self-pay | Attending: Emergency Medicine | Admitting: Emergency Medicine

## 2019-12-07 ENCOUNTER — Other Ambulatory Visit: Payer: Self-pay

## 2019-12-07 ENCOUNTER — Encounter (HOSPITAL_COMMUNITY): Payer: Self-pay | Admitting: Emergency Medicine

## 2019-12-07 DIAGNOSIS — R1032 Left lower quadrant pain: Secondary | ICD-10-CM | POA: Insufficient documentation

## 2019-12-07 DIAGNOSIS — N200 Calculus of kidney: Secondary | ICD-10-CM

## 2019-12-07 LAB — CBC
HCT: 38.5 % (ref 36.0–46.0)
Hemoglobin: 12.4 g/dL (ref 12.0–15.0)
MCH: 27.3 pg (ref 26.0–34.0)
MCHC: 32.2 g/dL (ref 30.0–36.0)
MCV: 84.6 fL (ref 80.0–100.0)
Platelets: 259 10*3/uL (ref 150–400)
RBC: 4.55 MIL/uL (ref 3.87–5.11)
RDW: 14.5 % (ref 11.5–15.5)
WBC: 15.7 10*3/uL — ABNORMAL HIGH (ref 4.0–10.5)
nRBC: 0 % (ref 0.0–0.2)

## 2019-12-07 LAB — COMPREHENSIVE METABOLIC PANEL
ALT: 55 U/L — ABNORMAL HIGH (ref 0–44)
AST: 31 U/L (ref 15–41)
Albumin: 3.5 g/dL (ref 3.5–5.0)
Alkaline Phosphatase: 74 U/L (ref 38–126)
Anion gap: 13 (ref 5–15)
BUN: 17 mg/dL (ref 6–20)
CO2: 19 mmol/L — ABNORMAL LOW (ref 22–32)
Calcium: 8.4 mg/dL — ABNORMAL LOW (ref 8.9–10.3)
Chloride: 99 mmol/L (ref 98–111)
Creatinine, Ser: 1.06 mg/dL — ABNORMAL HIGH (ref 0.44–1.00)
GFR, Estimated: >60 mL/min (ref >60)
Glucose, Bld: 135 mg/dL — ABNORMAL HIGH (ref 70–99)
Potassium: 3.8 mmol/L (ref 3.5–5.1)
Sodium: 131 mmol/L — ABNORMAL LOW (ref 135–145)
Total Bilirubin: 1.6 mg/dL — ABNORMAL HIGH (ref 0.3–1.2)
Total Protein: 7.3 g/dL (ref 6.5–8.1)

## 2019-12-07 LAB — LIPASE, BLOOD: Lipase: 28 U/L (ref 11–51)

## 2019-12-07 LAB — I-STAT BETA HCG BLOOD, ED (MC, WL, AP ONLY): I-stat hCG, quantitative: <5 m[IU]/mL (ref <5)

## 2019-12-07 NOTE — ED Triage Notes (Signed)
Pt to ED with c/o LLQ pain onset this am  Pt sts she started her menses today also.  Pt denies any nausea or urinary symptoms

## 2019-12-08 ENCOUNTER — Emergency Department (HOSPITAL_COMMUNITY): Payer: Self-pay

## 2019-12-08 ENCOUNTER — Other Ambulatory Visit: Payer: Self-pay

## 2019-12-08 LAB — URINALYSIS, ROUTINE W REFLEX MICROSCOPIC
Bilirubin Urine: NEGATIVE
Glucose, UA: NEGATIVE mg/dL
Ketones, ur: 5 mg/dL — AB
Nitrite: NEGATIVE
Protein, ur: 100 mg/dL — AB
Specific Gravity, Urine: 1.013 (ref 1.005–1.030)
pH: 6 (ref 5.0–8.0)

## 2019-12-08 LAB — WET PREP, GENITAL
Clue Cells Wet Prep HPF POC: NONE SEEN
Sperm: NONE SEEN
Trich, Wet Prep: NONE SEEN
WBC, Wet Prep HPF POC: NONE SEEN
Yeast Wet Prep HPF POC: NONE SEEN

## 2019-12-08 LAB — URINALYSIS, MICROSCOPIC (REFLEX)
Bacteria, UA: NONE SEEN
RBC / HPF: >50 RBC/hpf (ref 0–5)

## 2019-12-08 MED ORDER — MORPHINE SULFATE (PF) 4 MG/ML IV SOLN: 4.0000 mg | Freq: Once | INTRAVENOUS | Status: DC

## 2019-12-08 MED ORDER — HYDROCODONE-ACETAMINOPHEN 5-325 MG PO TABS
1.0000 | ORAL_TABLET | ORAL | 0 refills | Status: DC | PRN
Start: 2019-12-08 — End: 2020-11-26

## 2019-12-08 MED ORDER — KETOROLAC TROMETHAMINE 30 MG/ML IJ SOLN
30.0000 mg | Freq: Once | INTRAMUSCULAR | Status: AC
  Administered 2019-12-08: 30 mg via INTRAVENOUS
  Filled 2019-12-08: qty 1

## 2019-12-08 MED ORDER — KETOROLAC TROMETHAMINE 30 MG/ML IJ SOLN: 30.0000 mg | Freq: Once | INTRAMUSCULAR | Status: DC

## 2019-12-08 MED ORDER — ONDANSETRON HCL 4 MG/2ML IJ SOLN
4.0000 mg | Freq: Once | INTRAMUSCULAR | Status: AC
  Administered 2019-12-08: 4 mg via INTRAVENOUS
  Filled 2019-12-08: qty 2

## 2019-12-08 MED ORDER — MORPHINE SULFATE (PF) 4 MG/ML IV SOLN
4.0000 mg | Freq: Once | INTRAVENOUS | Status: AC
  Administered 2019-12-08: 4 mg via INTRAVENOUS
  Filled 2019-12-08: qty 1

## 2019-12-08 MED ORDER — TAMSULOSIN HCL 0.4 MG PO CAPS: 0.4000 mg | ORAL_CAPSULE | Freq: Every day | ORAL | 0 refills | Status: DC

## 2019-12-08 NOTE — ED Notes (Signed)
Patient transported to [facility] 

## 2019-12-08 NOTE — ED Provider Notes (Signed)
[facility] EMERGENCY DEPARTMENT Provider Note   CSN: [id number] Arrival date & time: 12/07/19  2056     History Chief Complaint  Patient presents with  . Abdominal Pain    Sonya Gregory is a 44 y.o. female with a history of hypothyroidism who presents the emergency department with a chief complaint of pelvic pain.  The patient endorses left-sided pelvic pain that awoke her from sleep this morning.  Pain is characterized as sharp.  It has been constant, but has been waxing and waning in intensity. 10/10 at maximal intensity. No known aggravating or alleviating factors.  She does state that she had an episode of loose stools earlier today, but this is since resolved.  She does also note that she started her menstrual cycle this morning.  She denies fever, chills, shortness of breath, chest pain, flank pain, dysuria, urinary frequency or hesitancy, vaginal discharge, nausea, vomiting, or constipation.  She has a history of ovarian cysts.  She is not currently on any form of birth control.  She has not been sexually active in 1 year.  No history of similar pain.  The history is provided by the patient and medical records. A language interpreter was used ([place]).       Past Medical History:  Diagnosis Date  . Iritis 12-2011    dx per ophtalmology  . Thyroid disease     Patient Active Problem List   Diagnosis Date Noted  . Screening breast examination 08/13/2018  . Breast pain 08/13/2018  . Pharyngitis 02/05/2012  . Iritis 02/05/2012    Past Surgical History:  Procedure Laterality Date  . CESAREAN SECTION       OB History    Gravida  2   Para      Term      Preterm      AB  1   Living  1     SAB  1   TAB      Ectopic      Multiple      Live Births  1           Family History  Problem Relation Age of Onset  . Hypertension Paternal Grandmother   . Diabetes Father   . Arthritis/Rheumatoid Mother   . Colon cancer Neg  Hx   . CAD Neg Hx     Social History   Tobacco Use  . Smoking status: Never Smoker  . Smokeless tobacco: Never Used  Vaping Use  . Vaping Use: Never used  Substance Use Topics  . Alcohol use: No  . Drug use: No    Home Medications Prior to Admission medications   Medication Sig Start Date End Date Taking? Authorizing Provider  levothyroxine (SYNTHROID) 25 MCG tablet Take 25 mcg by mouth every morning. 09/29/19  Yes [provider]    Allergies    Patient has no known allergies.  Review of Systems   Review of Systems  Constitutional: Negative for activity change, chills and fever.  Eyes: Negative for visual disturbance.  Respiratory: Negative for shortness of breath.   Cardiovascular: Negative for chest pain.  Gastrointestinal: Positive for diarrhea (x1). Negative for abdominal pain, anal bleeding, blood in stool, constipation, nausea and vomiting.  Genitourinary: Positive for menstrual problem and pelvic pain. Negative for dysuria, flank pain, frequency, urgency, vaginal bleeding, vaginal discharge and vaginal pain.  Musculoskeletal: Negative for back pain, myalgias and neck pain.  Skin: Negative for rash.  Allergic/Immunologic: Negative for immunocompromised  state.  Neurological: Negative for dizziness, syncope, weakness, numbness and headaches.  Psychiatric/Behavioral: Negative for confusion.    Physical Exam Updated Vital Signs BP 115/75   Pulse 76   Temp 98 F (36.7 C) (Oral)   Resp 15   Ht 5' (1.524 m)   Wt 77.1 kg   LMP 12/07/2019 (Exact Date)   SpO2 100%   BMI 33.20 kg/m   Physical Exam Vitals and nursing note reviewed.  Constitutional:      General: She is not in acute distress.    Comments: Appears uncomfortable  HENT:     Head: Normocephalic.  Eyes:     Conjunctiva/sclera: Conjunctivae normal.  Cardiovascular:     Rate and Rhythm: Normal rate and regular rhythm.     Heart sounds: No murmur heard.  No friction rub. No gallop.     Pulmonary:     Effort: Pulmonary effort is normal. No respiratory distress.     Breath sounds: No stridor. No wheezing, rhonchi or rales.  Chest:     Chest wall: No tenderness.  Abdominal:     General: There is no distension.     Palpations: Abdomen is soft. There is no mass.     Tenderness: There is abdominal tenderness. There is guarding. There is no right CVA tenderness, left CVA tenderness or rebound.     Hernia: No hernia is present.     Comments: Left-sided pelvic tenderness with guarding.  No rebound.  No CVA tenderness bilaterally.  No tenderness over McBurney's point.  Negative Murphy sign.  Normoactive bowel sounds.  Abdomen is soft, nondistended.  Genitourinary:    Comments: Chaperoned exam.  No cervical motion tenderness.  There is adnexal tenderness on the left, but no fullness.  No right adnexal tenderness or fullness.  Moderate dark red blood is noted in the vaginal vault. Musculoskeletal:     Cervical back: Neck supple.     Right lower leg: No edema.     Left lower leg: No edema.  Skin:    General: Skin is warm.     Capillary Refill: Capillary refill takes less than 2 seconds.     Findings: No rash.  Neurological:     Mental Status: She is alert.  Psychiatric:        Behavior: Behavior normal.     ED Results / Procedures / Treatments   Labs (all labs ordered are listed, but only abnormal results are displayed) Labs Reviewed  COMPREHENSIVE METABOLIC PANEL - Abnormal; Notable for the following components:      Result Value   Sodium 131 (*)    CO2 19 (*)    Glucose, Bld 135 (*)    Creatinine, Ser 1.06 (*)    Calcium 8.4 (*)    ALT 55 (*)    Total Bilirubin 1.6 (*)    All other components within normal limits  CBC - Abnormal; Notable for the following components:   WBC 15.7 (*)    All other components within normal limits  URINALYSIS, ROUTINE W REFLEX MICROSCOPIC - Abnormal; Notable for the following components:   Color, Urine AMBER (*)    APPearance HAZY  (*)    Hgb urine dipstick LARGE (*)    Ketones, ur 5 (*)    Protein, ur 100 (*)    Leukocytes,Ua TRACE (*)    All other components within normal limits  WET PREP, GENITAL  LIPASE, BLOOD  URINALYSIS, MICROSCOPIC (REFLEX)  I-STAT BETA HCG BLOOD, ED ([facility], [facility], [facility] ONLY)  GC/CHLAMYDIA PROBE AMP ([place]) NOT AT [facility]    EKG None  Radiology US PELVIC COMPLETE W TRANSVAGINAL AND TORSION R/O  Addendum Date: 12/08/2019   ADDENDUM REPORT: 12/08/2019 07:01 ADDENDUM: Doppler analysis of both ovaries was performed. Normal arterial and venous flow is present bilaterally. No evidence for torsion. Electronically Signed   By: Marin Roberts M.D.   On: 12/08/2019 07:01   Result Date: 12/08/2019 CLINICAL DATA:  Left-sided pelvic pain. Personal history of ovarian cyst. EXAM: TRANSABDOMINAL AND TRANSVAGINAL ULTRASOUND OF PELVIS TECHNIQUE: Both transabdominal and transvaginal ultrasound examinations of the pelvis were performed. Transabdominal technique was performed for global imaging of the pelvis including uterus, ovaries, adnexal regions, and pelvic cul-de-sac. It was necessary to proceed with endovaginal exam following the transabdominal exam to visualize the adnexa. COMPARISON:  None FINDINGS: Uterus Measurements: 8.8 x 4.3 x 5.1 cm = volume: 100 mL. No fibroids or other mass visualized. Benign nabothian cyst evident at the cervix. Endometrium Thickness: 12.8 mm, within normal limits. No focal abnormality visualized. Right ovary Measurements: 1.4 x 0.9 x 1.0 cm = volume: 0.6 mL. Normal appearance/no adnexal mass. Left ovary Measurements: 0.9 x 1.5 x 2.0 cm = volume: 2.9 mL. Normal appearance/no adnexal mass. Other findings No abnormal free fluid. IMPRESSION: 1. Normal sonographic appearance of the uterus and adnexa bilaterally. No acute or focal abnormality to explain the patient's left lower quadrant pain. Electronically Signed: By: Marin Roberts M.D. On: 12/08/2019 06:39     Procedures Procedures (including critical care time)  Medications Ordered in ED Medications  morphine 4 MG/ML injection 4 mg (4 mg Intravenous Given 12/08/19 0524)  ondansetron (ZOFRAN) injection 4 mg (4 mg Intravenous Given 12/08/19 0524)  morphine 4 MG/ML injection 4 mg (4 mg Intravenous Given 12/08/19 0701)    ED Course  I have reviewed the triage vital signs and the nursing notes.  Pertinent labs & imaging results that were available during my care of the patient were reviewed by me and considered in my medical decision making (see chart for details).    MDM Rules/Calculators/A&P                          44 year old female with a history of hypothyroidism who presents to the emergency department with sudden onset left-sided pelvic pain that awoke her from sleep.  She did have one episode of loose stools yesterday morning, but this is since resolved.  She also notably started her menstrual cycle yesterday morning.  She has a history of ovarian cyst and is not currently on birth control.  She has not been sexually active for 1 year and has no concerns for STIs at this time.  Differential diagnosis includes ovarian torsion, ruptured ovarian cyst, hemorrhagic ovarian cyst, normal menstrual cramps, obstructive uropathy, pyelonephritis, UTI, atypical presenting appendicitis, mesenteric ischemia, or diverticulitis.  She is afebrile and vital signs are otherwise unremarkable.  Labs have been reviewed and independently interpreted by me.  She does have a leukocytosis of 15K.  No metabolic derangements.  Wet prep is unremarkable.  UA with hemoglobinuria.   Given her exam, there is suspicion for ovarian torsion.  Imaging has been reviewed and independently interpreted by me.  Pelvic ultrasound is unremarkable.  I did discuss pelvic ultrasound results with Dr. Alfredo Batty, radiology, as blood flow was not initially documented on imaging results.  This has been amended it was  unremarkable.  However, on reevaluation, patient did have minimal improvement initially with morphine,  but after pelvic ultrasound is markedly uncomfortable.  Will redose morphine for pain control.  Given hemoglobinuria, could also consider that patient has an obstructive uropathy, although most likely source of hemoglobinuria as her menstrual cycle.  She does note that her pain has been constant, but waxing and waning.  I think it is reasonable to order a CT stone study for further evaluation.  Patient care transferred to [place] at the end of my shift to follow-up on CT stone study.  If study is unremarkable, she should be reassessed for pain.  If she continues to have severe pain, could also consider intermittent ovarian torsion on the differential diagnosis and she may benefit from OB/GYN consult.  Patient presentation, ED course, and plan of care discussed with review of all pertinent labs and imaging. Please see his/her note for further details regarding further ED course and disposition.  Final Clinical Impression(s) / ED Diagnoses Final diagnoses:  LLQ pain    Rx / DC Orders ED Discharge Orders    None       Hilery Wintle, Coral Else, PA-C 12/08/19 0739    Ward, Layla Maw, DO 12/08/19 [phone]

## 2019-12-08 NOTE — ED Notes (Signed)
At bedside for pelvic exam

## 2019-12-08 NOTE — ED Provider Notes (Signed)
Care transferred from [place], [place] at shift change.  See note for full HPI  In summation 44 year old presents for evaluation of left pelvic, abdominal pain.  Has not been sexually active in 1 year.  Episode of diarrhea yesterday however none since.  No prior history of diverticulosis or diverticulitis.  No urinary complaints.  She is on her menstrual cycle.    [place] reassuring WO torsion, cyst Preg neg CBC with leukocytosis  Plan follow up on Stone study, reassess. If significant pain consult Ob. If pain controlled dc home with Ob follow up.  Physical Exam  BP 102/67   Pulse 79   Temp 98.9 F (37.2 C) (Oral)   Resp 15   Ht 5' (1.524 m)   Wt 77.1 kg   LMP 12/07/2019 (Exact Date)   SpO2 97%   BMI 33.20 kg/m   Physical Exam Vitals and nursing note reviewed.  Constitutional:      General: She is not in acute distress.    Appearance: She is well-developed. She is not ill-appearing, toxic-appearing or diaphoretic.  HENT:     Head: Normocephalic and atraumatic.  Eyes:     Pupils: Pupils are equal, round, and reactive to light.  Cardiovascular:     Rate and Rhythm: Normal rate.     Heart sounds: Normal heart sounds.  Pulmonary:     Effort: Pulmonary effort is normal. No respiratory distress.     Breath sounds: Normal breath sounds.  Abdominal:     General: Bowel sounds are normal. There is no distension.     Palpations: Abdomen is soft.     Tenderness: There is no abdominal tenderness. There is no right CVA tenderness, left CVA tenderness, guarding or rebound. Negative signs include Murphy's sign and McBurney's sign.     Hernia: No hernia is present.     Comments: Mild tenderness to LLQ, some mild left lower quadrant tenderness however no rebound or guarding  Musculoskeletal:        General: Normal range of motion.     Cervical back: Normal range of motion.  Skin:    General: Skin is warm and dry.  Neurological:     Mental Status: She is alert.     ED Course/Procedures      Procedures CT Renal Stone Study  Result Date: 12/08/2019 CLINICAL DATA:  Hematuria EXAM: CT ABDOMEN AND PELVIS WITHOUT CONTRAST TECHNIQUE: Multidetector CT imaging of the abdomen and pelvis was performed following the standard protocol without IV contrast. COMPARISON:  None. FINDINGS: Lower chest: Lung bases are clear. No effusions. Heart is normal size. Hepatobiliary: Diffuse low-density throughout the liver compatible with fatty infiltration. No focal abnormality. Gallbladder unremarkable. Pancreas: No focal abnormality or ductal dilatation. Spleen: No focal abnormality.  Normal size. Adrenals/Urinary Tract: Moderate left hydronephrosis and perinephric standing. There appear to be 2 small distal left ureteral stones adjacent to 1 another measuring up to 5 mm. Multiple left renal stones. No stones on the right. Adrenal glands and urinary bladder are unremarkable. Stomach/Bowel: Stomach, large and small bowel grossly unremarkable. Vascular/Lymphatic: No evidence of aneurysm or adenopathy. Reproductive: Uterus and adnexa unremarkable.  No mass. Other: Trace free fluid in the pelvis.  No free air. Musculoskeletal: No acute bony abnormality. IMPRESSION: 2 small distal left ureteral stones measuring up to 5 mm. Moderate left hydronephrosis and perinephric stranding. Multiple nonobstructing left renal stones. Diffuse fatty infiltration of the liver. Electronically Signed   By: Charlett Nose M.D.   On: 12/08/2019 08:07   US  PELVIC COMPLETE W TRANSVAGINAL AND TORSION R/O  Addendum Date: 12/08/2019   ADDENDUM REPORT: 12/08/2019 07:01 ADDENDUM: Doppler analysis of both ovaries was performed. Normal arterial and venous flow is present bilaterally. No evidence for torsion. Electronically Signed   By: Marin Roberts M.D.   On: 12/08/2019 07:01   Result Date: 12/08/2019 CLINICAL DATA:  Left-sided pelvic pain. Personal history of ovarian cyst. EXAM: TRANSABDOMINAL AND TRANSVAGINAL ULTRASOUND OF PELVIS  TECHNIQUE: Both transabdominal and transvaginal ultrasound examinations of the pelvis were performed. Transabdominal technique was performed for global imaging of the pelvis including uterus, ovaries, adnexal regions, and pelvic cul-de-sac. It was necessary to proceed with endovaginal exam following the transabdominal exam to visualize the adnexa. COMPARISON:  None FINDINGS: Uterus Measurements: 8.8 x 4.3 x 5.1 cm = volume: 100 mL. No fibroids or other mass visualized. Benign nabothian cyst evident at the cervix. Endometrium Thickness: 12.8 mm, within normal limits. No focal abnormality visualized. Right ovary Measurements: 1.4 x 0.9 x 1.0 cm = volume: 0.6 mL. Normal appearance/no adnexal mass. Left ovary Measurements: 0.9 x 1.5 x 2.0 cm = volume: 2.9 mL. Normal appearance/no adnexal mass. Other findings No abnormal free fluid. IMPRESSION: 1. Normal sonographic appearance of the uterus and adnexa bilaterally. No acute or focal abnormality to explain the patient's left lower quadrant pain. Electronically Signed: By: Marin Roberts M.D. On: 12/08/2019 06:39   Labs Reviewed  COMPREHENSIVE METABOLIC PANEL - Abnormal; Notable for the following components:      Result Value   Sodium 131 (*)    CO2 19 (*)    Glucose, Bld 135 (*)    Creatinine, Ser 1.06 (*)    Calcium 8.4 (*)    ALT 55 (*)    Total Bilirubin 1.6 (*)    All other components within normal limits  CBC - Abnormal; Notable for the following components:   WBC 15.7 (*)    All other components within normal limits  URINALYSIS, ROUTINE W REFLEX MICROSCOPIC - Abnormal; Notable for the following components:   Color, Urine AMBER (*)    APPearance HAZY (*)    Hgb urine dipstick LARGE (*)    Ketones, ur 5 (*)    Protein, ur 100 (*)    Leukocytes,Ua TRACE (*)    All other components within normal limits  WET PREP, GENITAL  LIPASE, BLOOD  URINALYSIS, MICROSCOPIC (REFLEX)  I-STAT BETA HCG BLOOD, ED ([facility], [facility], [facility] ONLY)  GC/CHLAMYDIA PROBE AMP  ([place]) NOT AT [facility]   MDM  [place] reassuring without evidence of torsion, cyst, free fluid. GU exam by previous provider reassuring without CMT, did have left adnexal tenderness.  Plan on reassess pain and follow up on CT imaging.   CT scan with left nephrolitiasis.  Patient reassessed.  States her pain is significantly improved we will give dose of Toradol for anti-inflammatory effects.  UA does not look infected.  Creatinine 1.06 however no recent to compare.    Patient reassessed.  Pain controlled.  Tolerating p.o. intake.  No evidence of infected stone or evidence of obstruction.  She is urinating without difficulty.  Will provide short course of pain medicine.  Discussed return precautions with interpreter in room.  She will return for any worsening symptoms.  The patient has been appropriately medically screened and/or stabilized in the ED. I have low suspicion for any other emergent medical condition which would require further screening, evaluation or treatment in the ED or require inpatient management.  Patient is hemodynamically stable and in  no acute distress.  Patient able to ambulate in department prior to ED.  Evaluation does not show acute pathology that would require ongoing or additional emergent interventions while in the emergency department or further inpatient treatment.  I have discussed the diagnosis with the patient and answered all questions.  Pain is been managed while in the emergency department and patient has no further complaints prior to discharge.  Patient is comfortable with plan discussed in room and is stable for discharge at this time.  I have discussed strict return precautions for returning to the emergency department.  Patient was encouraged to follow-up with PCP/specialist refer to at discharge.      Kinya Meine A, PA-C 12/08/19 [id number]    Eber Hong, MD 12/09/19 [phone]

## 2019-12-08 NOTE — ED Notes (Signed)
Patient transported to CT 

## 2019-12-08 NOTE — Discharge Instructions (Signed)
Su tomografa computarizada mostr un clculo renal. Esta es probablemente la causa de su dolor.tome el medicamento para el dolor segn lo prescrito. No conduzca ni maneje maquinaria pesada mientras est tomando [employer].Tambin es otra mediacin para dilatar los urteres. Tenga cuidado ya que este medicamento puede causar [employer] al pasar de estar sentado a estar de pie [place] para sntomas nuevos o que empeoran

## 2019-12-09 LAB — GC/CHLAMYDIA PROBE AMP (~~LOC~~) NOT AT ARMC
Chlamydia: NEGATIVE
Comment: NEGATIVE
Comment: NORMAL
Neisseria Gonorrhea: NEGATIVE

## 2019-12-16 DIAGNOSIS — N200 Calculus of kidney: Secondary | ICD-10-CM | POA: Insufficient documentation

## 2019-12-16 HISTORY — DX: Calculus of kidney: N20.0

## 2020-11-26 ENCOUNTER — Ambulatory Visit: Payer: Self-pay | Admitting: Internal Medicine

## 2020-11-26 ENCOUNTER — Encounter: Payer: Self-pay | Admitting: Internal Medicine

## 2020-11-26 ENCOUNTER — Other Ambulatory Visit: Payer: Self-pay

## 2020-11-26 VITALS — BP 118/82 | HR 72 | Resp 12 | Ht 60.0 in | Wt 160.0 lb

## 2020-11-26 DIAGNOSIS — M940 Chondrocostal junction syndrome [Tietze]: Secondary | ICD-10-CM

## 2020-11-26 DIAGNOSIS — Z23 Encounter for immunization: Secondary | ICD-10-CM

## 2020-11-26 DIAGNOSIS — N200 Calculus of kidney: Secondary | ICD-10-CM

## 2020-11-26 DIAGNOSIS — L819 Disorder of pigmentation, unspecified: Secondary | ICD-10-CM

## 2020-11-26 DIAGNOSIS — E039 Hypothyroidism, unspecified: Secondary | ICD-10-CM

## 2020-11-26 NOTE — Patient Instructions (Signed)

## 2020-11-26 NOTE — Progress Notes (Unsigned)
    Subjective:    Patient ID: Sonya Gregory, female   DOB: [birth year], 45 y.o.   MRN: [id number]   HPI  Here to establish  Tildon Husky interprets   Mass left anterior rib cage:  has had for more than 1 year, perhaps enlarging a bit.  NT.  When had mammography and ultrasound of left breast in 2020, area she complains of was noted to be costocartilage and was told no concerning findings--to take NSAID.   2.  Manson Passey mark on skin:  left lower thoracic back:  Started about 5 months ago.  Was pink at the time--has a photo.  Was treated with Nystatin for 1 month, applying twice daily which took care of itching, but still there and has been turning brown.  No significant sun exposure to that area.  The lesion has not bled.    3.  Hypothyroidism:  diagnosed 2 years ago.  Last TSH checked 1 month ago and increased to 50 mcg from 25.  Was being seen at [facility] and then what sounds like [facility] on [facility].  4.  Hyperlipidemia with elevated triglycerides.  She has made minimal changes to diet since getting results a month ago.  Has not made any set goals.    5.  Left ureteral stones 11/2019:  treated in ED and apparently passed at home--resolved pain about 5 days later.  She never saw a stone.     Current Meds  Medication Sig  . levothyroxine (SYNTHROID) 25 MCG tablet Take 25 mcg by mouth every morning.   No Known Allergies   Review of Systems    Objective:   BP 118/82 (BP Location: Right Arm, Patient Position: Sitting, Cuff Size: Normal)   Pulse 72   Resp 12   Ht 5' (1.524 m)   Wt 160 lb (72.6 kg)   LMP 11/03/2020 (Exact Date)   BMI 31.25 kg/m   Physical Exam   Assessment & Plan   ***

## 2020-11-27 LAB — TSH: TSH: 3.75 u[IU]/mL (ref 0.450–4.500)

## 2020-12-21 IMAGING — CT CT RENAL STONE PROTOCOL
2 of 5 series · 17 of 46 positions shown, 19 images · non-contrast
Comparison: None.

CLINICAL DATA: Hematuria

EXAM:
CT ABDOMEN AND PELVIS WITHOUT CONTRAST
TECHNIQUE: Multidetector CT imaging of the abdomen and pelvis was performed
following the standard protocol without IV contrast.

[Series 4: thins · axial · 0.88mm/px · z∈[+861,+1308]mm · 14 of 692 slices shown, 16 images]
[im 27/692  soft-tissue]
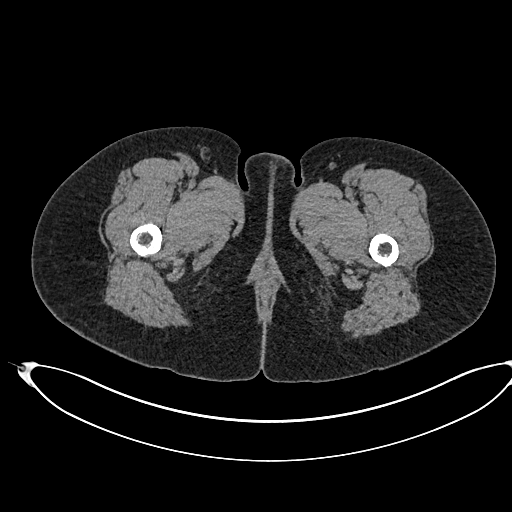
[im 27/692  bone]
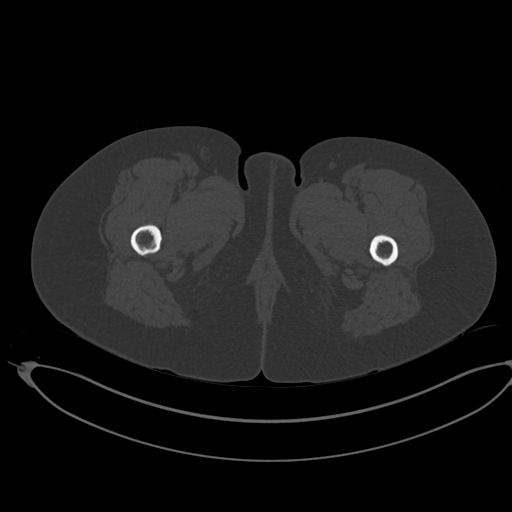
[im 80/692  soft-tissue]
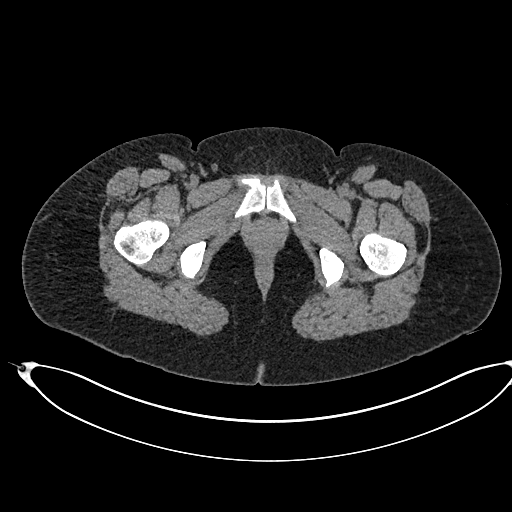
[im 133/692  soft-tissue]
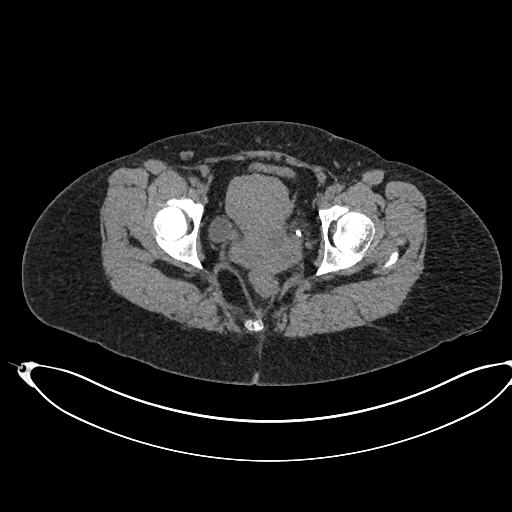
[im 187/692  soft-tissue]
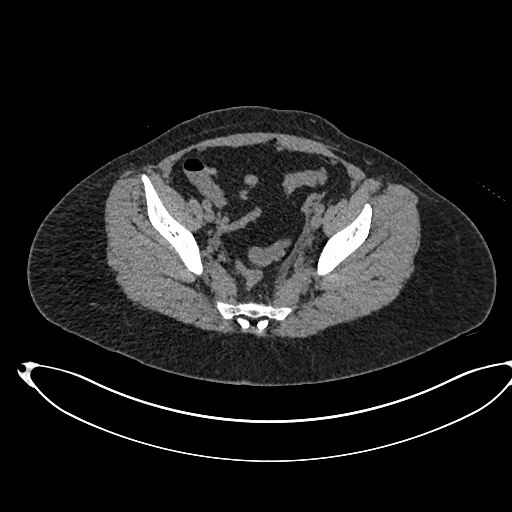
[im 240/692  soft-tissue]
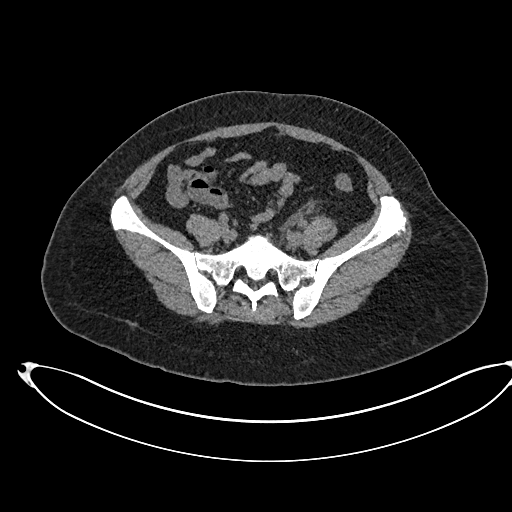
[im 266/692  soft-tissue]
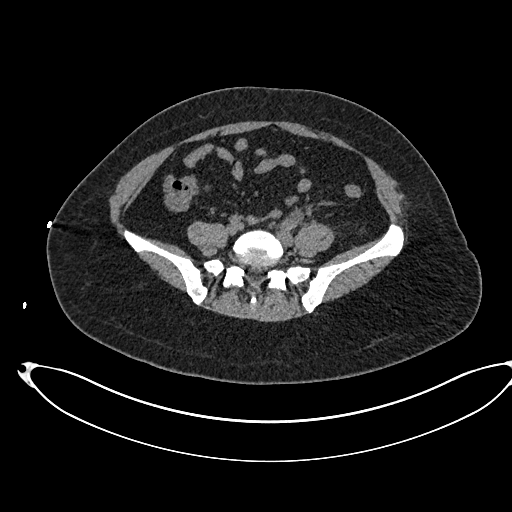
[im 319/692  soft-tissue]
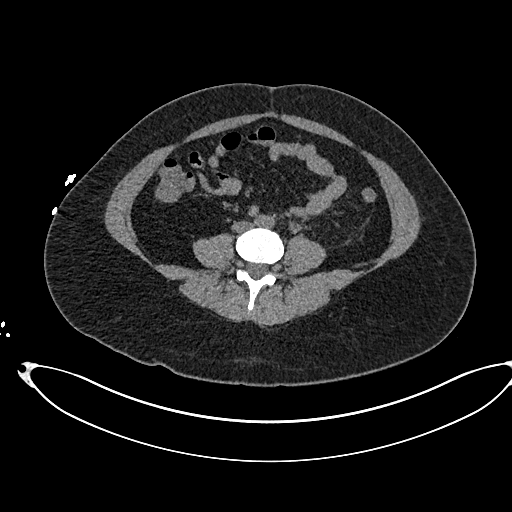
[im 373/692  soft-tissue]
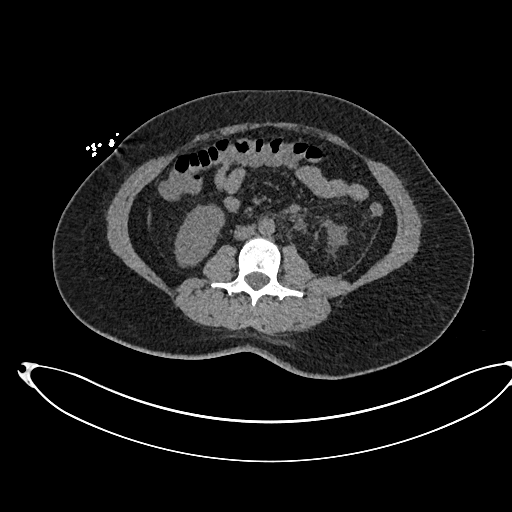
[im 426/692  soft-tissue]
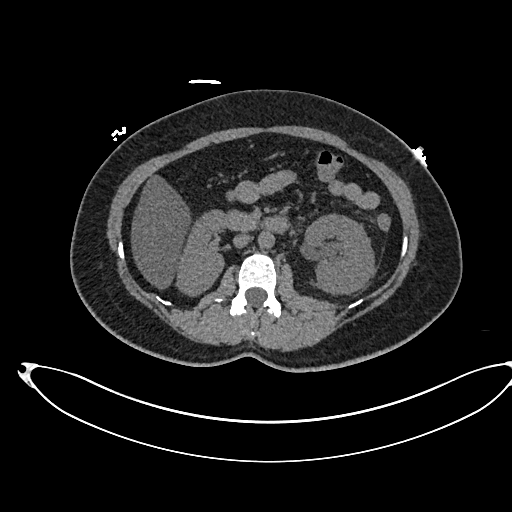
[im 426/692  bone]
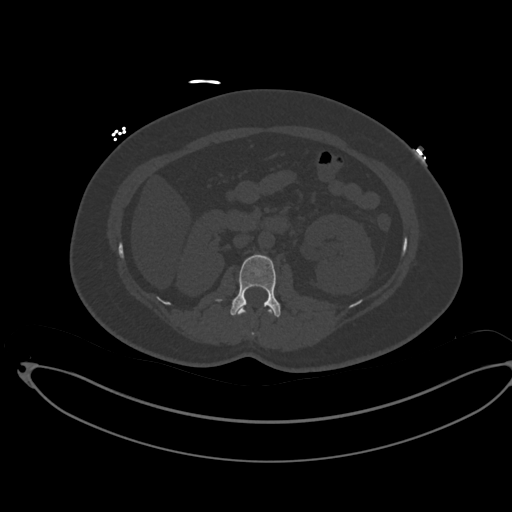
[im 452/692  soft-tissue]
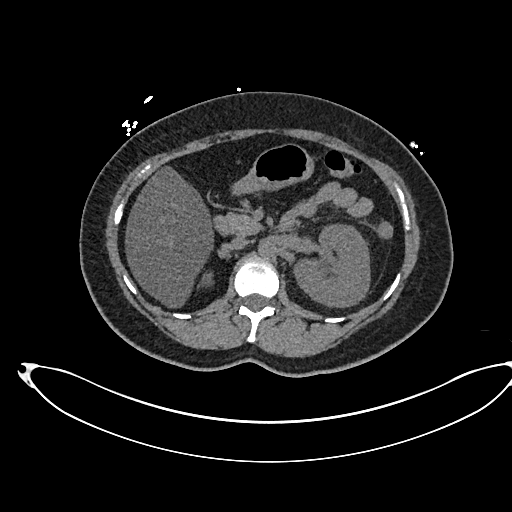
[im 505/692  soft-tissue]
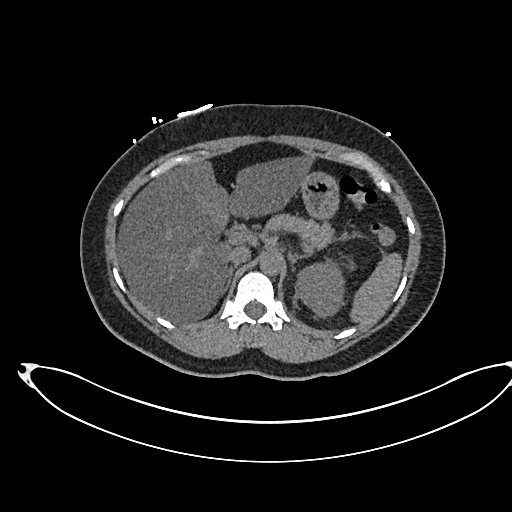
[im 559/692  soft-tissue]
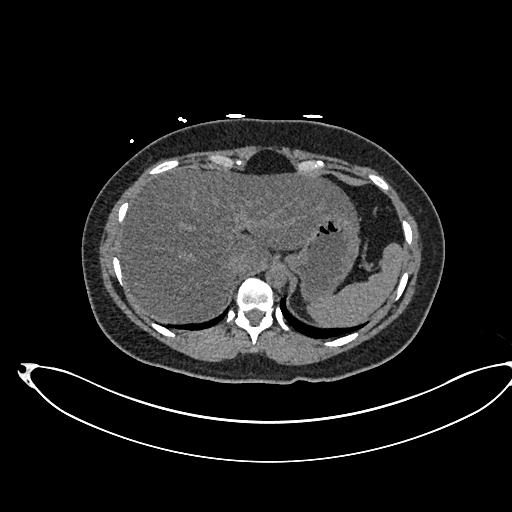
[im 612/692  soft-tissue]
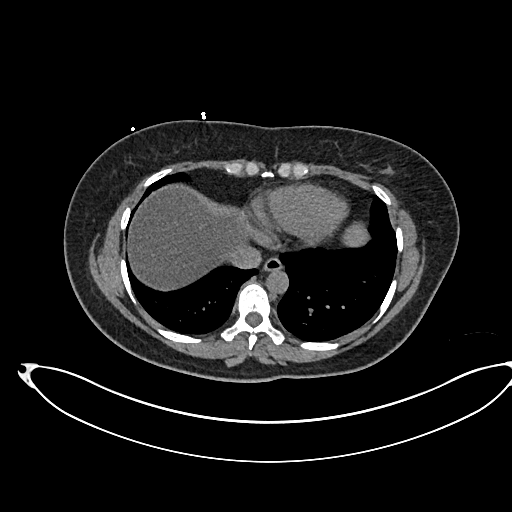
[im 665/692  soft-tissue]
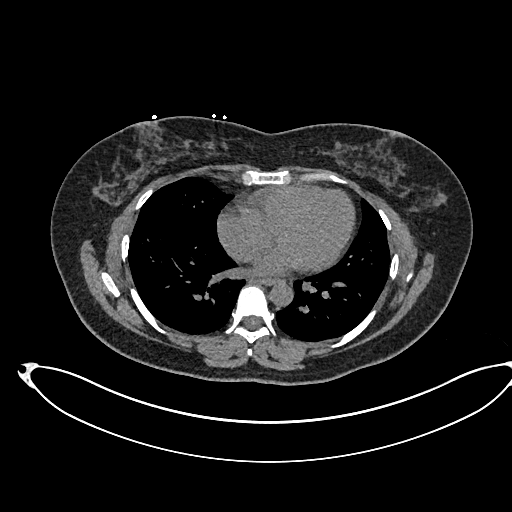

[Series 7: coronal soft tissue · coronal · 0.93mm/px · 3 of 107 slices shown]
[im 36/107  soft-tissue]
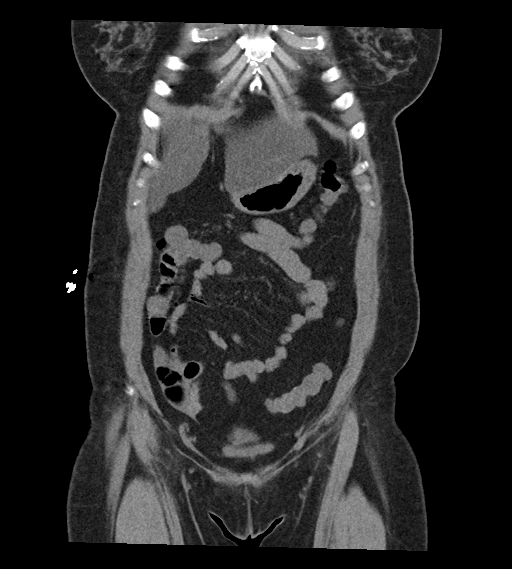
[im 48/107  soft-tissue]
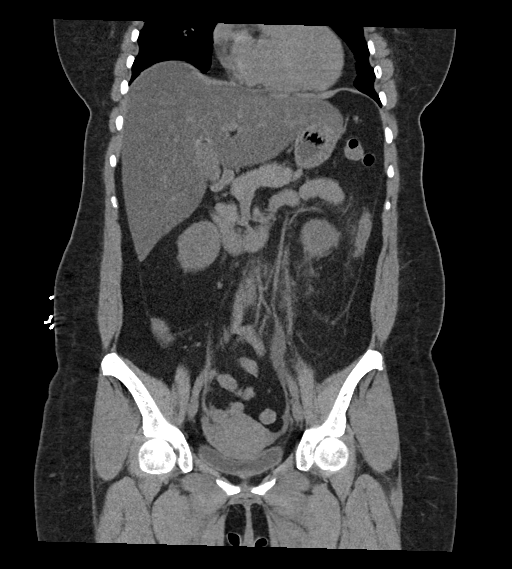
[im 59/107  soft-tissue]
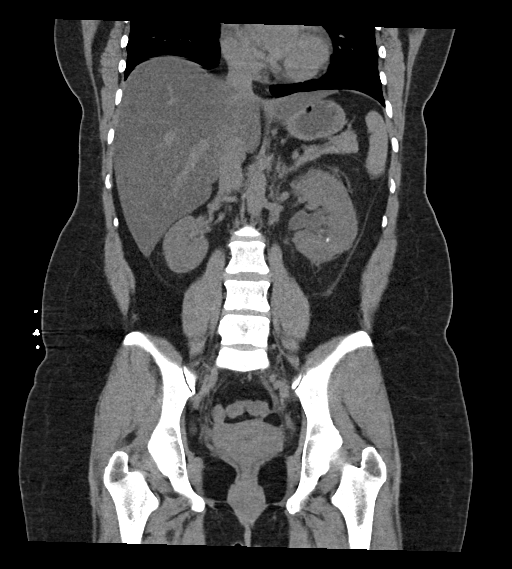

[17 of 46 positions shown; findings below may reference images not displayed]

FINDINGS: Lower chest: Lung bases are clear. No effusions. Heart is normal
size.

Hepatobiliary: Diffuse low-density throughout the liver compatible
with fatty infiltration. No focal abnormality. Gallbladder
unremarkable.

Pancreas: No focal abnormality or ductal dilatation.

Spleen: No focal abnormality.  Normal size.

Adrenals/Urinary Tract: Moderate left hydronephrosis and perinephric
standing. There appear to be 2 small distal left ureteral stones
adjacent to 1 another measuring up to 5 mm. Multiple left renal
stones. No stones on the right. Adrenal glands and urinary bladder
are unremarkable.

Stomach/Bowel: Stomach, large and small bowel grossly unremarkable.

Vascular/Lymphatic: No evidence of aneurysm or adenopathy.

Reproductive: Uterus and adnexa unremarkable.  No mass.

Other: Trace free fluid in the pelvis.  No free air.

Musculoskeletal: No acute bony abnormality.
IMPRESSION: 2 small distal left ureteral stones measuring up to 5 mm. Moderate
left hydronephrosis and perinephric stranding.

Multiple nonobstructing left renal stones.

Diffuse fatty infiltration of the liver.

## 2021-01-16 DIAGNOSIS — R7303 Prediabetes: Secondary | ICD-10-CM

## 2021-01-16 HISTORY — DX: Prediabetes: R73.03

## 2021-03-18 ENCOUNTER — Other Ambulatory Visit: Payer: Self-pay

## 2021-03-18 ENCOUNTER — Encounter: Payer: Self-pay | Admitting: Internal Medicine

## 2021-03-18 ENCOUNTER — Other Ambulatory Visit: Payer: Self-pay | Admitting: Internal Medicine

## 2021-03-18 ENCOUNTER — Ambulatory Visit: Payer: Self-pay | Admitting: Internal Medicine

## 2021-03-18 VITALS — BP 114/84 | HR 72 | Resp 12 | Ht 59.75 in | Wt 159.5 lb

## 2021-03-18 DIAGNOSIS — H547 Unspecified visual loss: Secondary | ICD-10-CM

## 2021-03-18 DIAGNOSIS — Z124 Encounter for screening for malignant neoplasm of cervix: Secondary | ICD-10-CM

## 2021-03-18 DIAGNOSIS — Z0000 Encounter for general adult medical examination without abnormal findings: Secondary | ICD-10-CM

## 2021-03-18 DIAGNOSIS — Z9189 Other specified personal risk factors, not elsewhere classified: Secondary | ICD-10-CM

## 2021-03-18 DIAGNOSIS — E039 Hypothyroidism, unspecified: Secondary | ICD-10-CM

## 2021-03-18 DIAGNOSIS — L989 Disorder of the skin and subcutaneous tissue, unspecified: Secondary | ICD-10-CM

## 2021-03-18 DIAGNOSIS — Z1231 Encounter for screening mammogram for malignant neoplasm of breast: Secondary | ICD-10-CM

## 2021-03-18 DIAGNOSIS — R7989 Other specified abnormal findings of blood chemistry: Secondary | ICD-10-CM

## 2021-03-18 MED ORDER — LEVOTHYROXINE SODIUM 25 MCG PO TABS: 50.0000 ug | ORAL_TABLET | Freq: Every morning | ORAL | 11 refills | Status: DC

## 2021-03-18 NOTE — Progress Notes (Signed)
? ? ?Subjective:  ?  ?Patient ID: Sonya Gregory, female   DOB: 1975/01/20, 46 y.o.   MRN: [id number] ? ? ?HPI ? ?Tildon Husky interprets ? ?CPE with pap ? ?1.  Pap:  Last pap was 03/2016 and normal.  Always normal.   ? ?2.  Mammogram:  Last 08/16/2018.  Patient worried about thickening of costochondral area under left breast then and when first seen here.  Consistent with costochondritis.  Also with benign cyst in left breast.  No family history of breast cancer. ? ?3.  Osteoprevention:  Drinks milk, but not much.  Willing to drink 3-4 cups milk daily.  She is physically active daily, but not enough per patient--willing to increase gradually to 1 hour daily.   ? ?4.  Guaiac Cards/FIT:  Never.   ? ?5.  Colonoscopy:  Never.  No family history of colon cancer. ? ?6.  Immunizations:  Has not received any COVID vaccinations.   ? ?7.  Glucose/Cholesterol :  history of hyperglycemia with A1C of 5.6% in 2018.  Was with her well woman exam with [facility] at the time.  Cholesterol fine in 2018 ?Lipid Panel  ?   ?Component Value Date/Time  ? CHOL 176 04/07/2016 1005  ? TRIG 78 04/07/2016 1005  ? HDL 54 04/07/2016 1005  ? CHOLHDL 3.3 04/07/2016 1005  ? LDLCALC 106 (H) 04/07/2016 1005  ? LABVLDL 16 04/07/2016 1005  ?  ? ?Current Meds  ?Medication Sig  ? levothyroxine (SYNTHROID) 25 MCG tablet Take 50 mcg by mouth every morning.  ? ?No Known Allergies ? ?Past Medical History:  ?Diagnosis Date  ? Hyperlipidemia   ? Hypothyroidism (acquired) 02/16/2018  ? Iritis 12/2011  ? dx per ophtalmology  ? Nephrolithiasis 12/16/2019  ? left  ? ? ?Past Surgical History:  ?Procedure Laterality Date  ? CESAREAN SECTION    ? ?Family History  ?Problem Relation Age of Onset  ? Arthritis/Rheumatoid Mother   ? Diabetes Father   ? Obesity Brother   ? Hypertension Paternal Grandmother   ? Colon cancer Neg Hx   ? CAD Neg Hx   ? ?Social History  ? ?Socioeconomic History  ? Marital status: Married  ?  Spouse name: Claretha Cooper  ? Number of children: 1  ? Years of  education: Not on file  ? Highest education level: Bachelor's degree (e.g., BA, AB, BS)  ?Occupational History  ? Occupation: Housecleaning  ?Tobacco Use  ? Smoking status: Never  ? Smokeless tobacco: Never  ?Vaping Use  ? Vaping Use: Never used  ?Substance and Sexual Activity  ? Alcohol use: No  ? Drug use: No  ? Sexual activity: Yes  ?  Birth control/protection: None  ?Other Topics Concern  ? Not on file  ?Social History Narrative  ? Originally from [place]  ? Degree in Early Childhood education  ? Lives at home with husband and daughter.  ? ?Social Determinants of Health  ? ?Financial Resource Strain: Low Risk   ? Difficulty of Paying Living Expenses: Not hard at all  ?Food Insecurity: No Food Insecurity  ? Worried About Programme researcher, broadcasting/film/video in the Last Year: Never true  ? Ran Out of Food in the Last Year: Never true  ?Transportation Needs: No Transportation Needs  ? Lack of Transportation (Medical): No  ? Lack of Transportation (Non-Medical): No  ?Physical Activity: Not on file  ?Stress: Not on file  ?Social Connections: Not on file  ?Intimate Partner Violence: Not At Risk  ?  Fear of Current or Ex-Partner: No  ? Emotionally Abused: No  ? Physically Abused: No  ? Sexually Abused: No  ? ? ? ?Review of Systems  ?HENT:  Negative for dental problem.   ?Eyes:  Positive for visual disturbance (blurry and has to hold reading material away from her.).  ?Respiratory:  Negative for shortness of breath.   ?Cardiovascular:  Positive for palpitations (Only when more physically active, feels heart beat fast.). Negative for chest pain and leg swelling.  ?Gastrointestinal:  Negative for abdominal pain and blood in stool (No melena.).  ?Genitourinary:  Negative for menstrual problem.  ?Skin:   ?     Skin lesion on back, arm, left breast area.  Again, never obtained orange card for referral.  She has not noted new lesion.  ?Neurological:   ?     Left hand numbness when waking in morning--ulnar two fingers.  If shakes arm  and hand out, feeling comes back.  Sleeps on side.  Cannot say if on left side when has symptoms.  Not a daily complaint.    ? ? ? ?Objective:  ? ?BP 114/84 (BP Location: Left Arm, Patient Position: Sitting, Cuff Size: Normal)   Pulse 72   Resp 12   Ht 4' 11.75" (1.518 m)   Wt 159 lb 8 oz (72.3 kg)   LMP 02/27/2021 (Exact Date)   BMI 31.41 kg/m?  ? ?Physical Exam ?HENT:  ?   Head: Normocephalic and atraumatic.  ?   Right Ear: Tympanic membrane, ear canal and external ear normal.  ?   Left Ear: Tympanic membrane, ear canal and external ear normal.  ?   Nose: Nose normal.  ?   Mouth/Throat:  ?   Mouth: Mucous membranes are moist.  ?   Pharynx: Oropharynx is clear.  ?Eyes:  ?   Extraocular Movements: Extraocular movements intact.  ?   Conjunctiva/sclera: Conjunctivae normal.  ?   Pupils: Pupils are equal, round, and reactive to light.  ?   Comments: Discs sharp bilaterally.  ?Neck:  ?   Thyroid: No thyroid mass or thyromegaly.  ?Cardiovascular:  ?   Rate and Rhythm: Normal rate and regular rhythm.  ?   Heart sounds: S1 normal and S2 normal. No murmur heard. ?  No friction rub. No S3 or S4 sounds.  ?   Comments: No carotid bruits.  Carotid, radial, femoral, DP and PT pulses normal and equal.  ?  ?Pulmonary:  ?   Effort: Pulmonary effort is normal.  ?   Breath sounds: Normal breath sounds.  ?Chest:  ?Breasts: ?   Right: No inverted nipple, mass, nipple discharge, skin change or tenderness.  ?   Left: No inverted nipple, mass, nipple discharge, skin change or tenderness.  ?Abdominal:  ?   General: Bowel sounds are normal.  ?   Palpations: Abdomen is soft. There is no hepatomegaly, splenomegaly or mass.  ?   Tenderness: There is no abdominal tenderness.  ?   Hernia: No hernia is present.  ?Genitourinary: ?   Comments: Normal external female genitalia ?Scant thin white vaginal secretions, no odor, no underlying lesion or inflammation. ?No cervical lesion. ?No uterine or adnexal mass or tenderness. ?Significant  difficult relaxing pelvic musculature on exam.   ?Musculoskeletal:     ?   General: Normal range of motion.  ?   Cervical back: Normal range of motion and neck supple.  ?Lymphadenopathy:  ?   Head:  ?   Right side of  head: No submental or submandibular adenopathy.  ?   Left side of head: No submental or submandibular adenopathy.  ?   Cervical: No cervical adenopathy.  ?   Upper Body:  ?   Right upper body: No supraclavicular or axillary adenopathy.  ?   Left upper body: No supraclavicular or axillary adenopathy.  ?   Lower Body: No right inguinal adenopathy. No left inguinal adenopathy.  ?Skin: ?   General: Skin is warm.  ?   Findings: Lesion (light brown flat, non palpable lesions down ulnar right forearm--somewhat of a linear whorl pattern of ovoid lesions.  Also noted left mid thoracic back that may be part of lesions then extending around to lower lateral and anterior thoracic lesion) present.  ?Neurological:  ?   General: No focal deficit present.  ?   Mental Status: She is alert and oriented to person, place, and time.  ?   Cranial Nerves: Cranial nerves 2-12 are intact.  ?   Sensory: Sensation is intact.  ?   Motor: Motor function is intact.  ?   Coordination: Coordination is intact.  ?   Gait: Gait is intact.  ?   Deep Tendon Reflexes: Reflexes are normal and symmetric.  ?Psychiatric:     ?   Attention and Perception: Attention normal.     ?   Mood and Affect: Mood normal.     ?   Speech: Speech normal.     ?   Behavior: Behavior normal. Behavior is cooperative.  ?   Comments: Slightly anxious at times.  ? ? ? ?Assessment & Plan  ? ? CPE with pap ?mammogram ?FIT to return in 2 weeks ?FLP, CBC, CMP, A1C, TSH ?Needs orange card for dental, derm visits still. ? ?2.  Hypothyroidism:  not sure if taking hormone adequately as should have run out a while ago.  TSH ? ?3.  Hyperglycemia:  CMP, A1C ? ?4.  Decreased visual acuity:  needs orange card for optometry referral.  Encouraged obtaining reading glasses. ? ?5.   Skin lesions:  orange card for derm referral ? ?6.  Dental health:  needs orange card for dental referral ? ? ?

## 2021-03-21 LAB — CYTOLOGY - PAP

## 2021-03-22 LAB — COMPREHENSIVE METABOLIC PANEL
ALT: 137 IU/L — ABNORMAL HIGH (ref 0–32)
AST: 65 IU/L — ABNORMAL HIGH (ref 0–40)
Albumin/Globulin Ratio: 1.5 (ref 1.2–2.2)
Albumin: 4.5 g/dL (ref 3.8–4.8)
Alkaline Phosphatase: 101 IU/L (ref 44–121)
BUN/Creatinine Ratio: 24 — ABNORMAL HIGH (ref 9–23)
BUN: 17 mg/dL (ref 6–24)
Bilirubin Total: 0.9 mg/dL (ref 0.0–1.2)
CO2: 21 mmol/L (ref 20–29)
Calcium: 9.1 mg/dL (ref 8.7–10.2)
Chloride: 101 mmol/L (ref 96–106)
Creatinine, Ser: 0.71 mg/dL (ref 0.57–1.00)
Globulin, Total: 3 g/dL (ref 1.5–4.5)
Glucose: 94 mg/dL (ref 70–99)
Potassium: 4 mmol/L (ref 3.5–5.2)
Sodium: 136 mmol/L (ref 134–144)
Total Protein: 7.5 g/dL (ref 6.0–8.5)
eGFR: 107 mL/min/{1.73_m2} (ref >59)

## 2021-03-22 LAB — CBC WITH DIFFERENTIAL/PLATELET
Basophils Absolute: 0 10*3/uL (ref 0.0–0.2)
Basos: 0 %
EOS (ABSOLUTE): 0.1 10*3/uL (ref 0.0–0.4)
Eos: 2 %
Hematocrit: 42 % (ref 34.0–46.6)
Hemoglobin: 13.9 g/dL (ref 11.1–15.9)
Immature Grans (Abs): 0 10*3/uL (ref 0.0–0.1)
Immature Granulocytes: 0 %
Lymphocytes Absolute: 1.7 10*3/uL (ref 0.7–3.1)
Lymphs: 33 %
MCH: 29.4 pg (ref 26.6–33.0)
MCHC: 33.1 g/dL (ref 31.5–35.7)
MCV: 89 fL (ref 79–97)
Monocytes Absolute: 0.4 10*3/uL (ref 0.1–0.9)
Monocytes: 7 %
Neutrophils Absolute: 3.1 10*3/uL (ref 1.4–7.0)
Neutrophils: 58 %
Platelets: 240 10*3/uL (ref 150–450)
RBC: 4.72 x10E6/uL (ref 3.77–5.28)
RDW: 13.7 % (ref 11.7–15.4)
WBC: 5.3 10*3/uL (ref 3.4–10.8)

## 2021-03-22 LAB — TSH: TSH: 3.47 u[IU]/mL (ref 0.450–4.500)

## 2021-03-22 LAB — HGB A1C W/O EAG: Hgb A1c MFr Bld: 5.7 % — ABNORMAL HIGH (ref 4.8–5.6)

## 2021-03-22 LAB — LIPID PANEL W/O CHOL/HDL RATIO
Cholesterol, Total: 199 mg/dL (ref 100–199)
HDL: 53 mg/dL (ref >39)
LDL Chol Calc (NIH): 129 mg/dL — ABNORMAL HIGH (ref 0–99)
Triglycerides: 94 mg/dL (ref 0–149)
VLDL Cholesterol Cal: 17 mg/dL (ref 5–40)

## 2021-05-06 ENCOUNTER — Other Ambulatory Visit: Payer: Self-pay

## 2021-05-06 DIAGNOSIS — R748 Abnormal levels of other serum enzymes: Secondary | ICD-10-CM

## 2021-05-07 LAB — HEPATITIS C ANTIBODY: Hep C Virus Ab: NONREACTIVE

## 2021-05-07 LAB — HEPATITIS B CORE ANTIBODY, TOTAL: Hep B Core Total Ab: NEGATIVE

## 2021-05-07 LAB — HEPATITIS A ANTIBODY, TOTAL: hep A Total Ab: POSITIVE — AB

## 2021-05-07 LAB — HEPATITIS B SURFACE ANTIBODY,QUALITATIVE: Hep B Surface Ab, Qual: NONREACTIVE

## 2021-05-07 LAB — HEPATITIS B SURFACE ANTIGEN: Hepatitis B Surface Ag: NEGATIVE

## 2021-07-06 ENCOUNTER — Other Ambulatory Visit: Payer: Self-pay | Admitting: Internal Medicine

## 2021-07-06 ENCOUNTER — Encounter: Payer: Self-pay | Admitting: Internal Medicine

## 2021-07-06 DIAGNOSIS — J019 Acute sinusitis, unspecified: Secondary | ICD-10-CM

## 2021-07-06 MED ORDER — AMOXICILLIN 500 MG PO CAPS: 500.0000 mg | ORAL_CAPSULE | Freq: Three times a day (TID) | ORAL | 0 refills | Status: AC

## 2021-07-06 NOTE — Progress Notes (Signed)
Patient wanted to strep test after having a sore throat for 3 week. During the first week symptoms included sore throat, fever and chills. After taking OTC cold medicine, patient has only had sore throat. Patients daughter was also sick about the same time and was diagnosed with strep throat, but she was not tested for it during her daughters pediatric visit.  Patients POCT streph test was negative.

## 2021-07-06 NOTE — Progress Notes (Signed)
    Subjective:    Patient ID: Sonya Gregory, female   DOB: 09/26/1975, 46 y.o.   MRN: [id number]   HPI  Duayne Cal interprets  Came to get a strep test as daughter tested + last Friday.  Negative here today.  She has been having URI symptoms, including sore throat now for 3 weeks.  No rash or joint complaints currently.  Daughter with same symptoms as patient--started before patient's symptoms.  Patient developed fever and body aches 3 weeks ago.  Next 24 hours, developed a sore throat as well as a cough.  Has been tea drinks, using Mucinex with apparently a fever reducer, which helped the cough.  States cough was dry.     No outpatient medications have been marked as taking for the 07/06/21 encounter (Lab) with Julieanne Manson, MD.   No Known Allergies   Review of Systems    Objective:   There were no vitals taken for this visit.  Physical Exam NAD.  Coughing during exam--mild HEENT:  PERRL, EOMI, TMs pearly gray, throat without injection or exudate.  No posterior pharyngeal cobbling.  Nasal mucosa swollen with yellow green crusting. Neck:  Supple, shotty anterior cervical nodes that are mildly tender.  Chest:  CTA  CV:  RRR without murmur or rub.  Radial pulses normal and equal Right upper arm with what appears to be bruising under skin with a central clearing--very defined central clearing and edges of bruising as well.  Just smaller than a dime in diameter.   Assessment & Plan    Likely Sinusitis:  Amoxicillin 500 mg 3 times daily for 7 days.  Continue Mucinex.  Call if does not improve.

## 2021-08-31 ENCOUNTER — Ambulatory Visit: Payer: Self-pay | Admitting: Internal Medicine

## 2021-09-08 ENCOUNTER — Ambulatory Visit (INDEPENDENT_AMBULATORY_CARE_PROVIDER_SITE_OTHER): Payer: Self-pay | Admitting: Internal Medicine

## 2021-09-08 DIAGNOSIS — Z23 Encounter for immunization: Secondary | ICD-10-CM

## 2021-11-10 ENCOUNTER — Ambulatory Visit (INDEPENDENT_AMBULATORY_CARE_PROVIDER_SITE_OTHER): Payer: Self-pay | Admitting: Internal Medicine

## 2021-11-10 DIAGNOSIS — Z23 Encounter for immunization: Secondary | ICD-10-CM

## 2021-11-10 NOTE — Progress Notes (Signed)
Also with flaccid like mildly erythematous blister at inner canthus of left eye for 3 days.  No change in size last 2 days.  No itching.  No new exposures  Cool compress to area.  Call if enlarges or worsens or if does not resolve.

## 2022-02-09 ENCOUNTER — Other Ambulatory Visit: Payer: Self-pay | Admitting: Internal Medicine

## 2022-03-24 ENCOUNTER — Encounter: Payer: Self-pay | Admitting: Internal Medicine

## 2022-04-10 ENCOUNTER — Other Ambulatory Visit: Payer: Self-pay | Admitting: Internal Medicine

## 2022-04-13 ENCOUNTER — Other Ambulatory Visit: Payer: Self-pay | Admitting: Internal Medicine

## 2022-04-18 NOTE — Telephone Encounter (Signed)
Can you check on what she is actually taking and fill for a year out from her last visit?  Also-need to check when her last TSH was and will get back with you

## 2022-04-18 NOTE — Telephone Encounter (Signed)
Voicemail left asking to return our call 

## 2022-04-18 NOTE — Telephone Encounter (Signed)
I think I filled only for 25 mcg to be taken back in January (only 30 dispensed each time, though to take 2 tabs daily).  So confused as to what she has been taking.  She also does need to come in for TSH.

## 2022-04-20 ENCOUNTER — Other Ambulatory Visit: Payer: Self-pay | Admitting: Internal Medicine

## 2022-04-21 NOTE — Telephone Encounter (Signed)
Voicemail left asking to return our call 

## 2022-04-28 ENCOUNTER — Other Ambulatory Visit: Payer: Self-pay | Admitting: Internal Medicine

## 2022-05-02 ENCOUNTER — Other Ambulatory Visit: Payer: Self-pay

## 2022-05-02 DIAGNOSIS — E039 Hypothyroidism, unspecified: Secondary | ICD-10-CM

## 2022-05-02 DIAGNOSIS — Z0000 Encounter for general adult medical examination without abnormal findings: Secondary | ICD-10-CM

## 2022-05-03 ENCOUNTER — Encounter: Payer: Self-pay | Admitting: Internal Medicine

## 2022-05-03 LAB — CBC WITH DIFFERENTIAL/PLATELET
Basophils Absolute: 0 10*3/uL (ref 0.0–0.2)
Basos: 0 %
EOS (ABSOLUTE): 0.2 10*3/uL (ref 0.0–0.4)
Eos: 3 %
Hematocrit: 39 % (ref 34.0–46.6)
Hemoglobin: 12.8 g/dL (ref 11.1–15.9)
Immature Grans (Abs): 0 10*3/uL (ref 0.0–0.1)
Immature Granulocytes: 0 %
Lymphocytes Absolute: 1.9 10*3/uL (ref 0.7–3.1)
Lymphs: 29 %
MCH: 29.4 pg (ref 26.6–33.0)
MCHC: 32.8 g/dL (ref 31.5–35.7)
MCV: 89 fL (ref 79–97)
Monocytes Absolute: 0.4 10*3/uL (ref 0.1–0.9)
Monocytes: 6 %
Neutrophils Absolute: 4.1 10*3/uL (ref 1.4–7.0)
Neutrophils: 62 %
Platelets: 242 10*3/uL (ref 150–450)
RBC: 4.36 x10E6/uL (ref 3.77–5.28)
RDW: 13.1 % (ref 11.7–15.4)
WBC: 6.5 10*3/uL (ref 3.4–10.8)

## 2022-05-03 LAB — TSH: TSH: 3.93 u[IU]/mL (ref 0.450–4.500)

## 2022-05-03 LAB — COMPREHENSIVE METABOLIC PANEL
ALT: 56 IU/L — ABNORMAL HIGH (ref 0–32)
AST: 27 IU/L (ref 0–40)
Albumin/Globulin Ratio: 1.5 (ref 1.2–2.2)
Albumin: 4.3 g/dL (ref 3.9–4.9)
Alkaline Phosphatase: 92 IU/L (ref 44–121)
BUN/Creatinine Ratio: 21 (ref 9–23)
BUN: 16 mg/dL (ref 6–24)
Bilirubin Total: 0.7 mg/dL (ref 0.0–1.2)
CO2: 20 mmol/L (ref 20–29)
Calcium: 9 mg/dL (ref 8.7–10.2)
Chloride: 104 mmol/L (ref 96–106)
Creatinine, Ser: 0.78 mg/dL (ref 0.57–1.00)
Globulin, Total: 2.8 g/dL (ref 1.5–4.5)
Glucose: 113 mg/dL — ABNORMAL HIGH (ref 70–99)
Potassium: 3.8 mmol/L (ref 3.5–5.2)
Sodium: 141 mmol/L (ref 134–144)
Total Protein: 7.1 g/dL (ref 6.0–8.5)
eGFR: 95 mL/min/{1.73_m2} (ref >59)

## 2022-05-03 LAB — HEMOGLOBIN A1C
Est. average glucose Bld gHb Est-mCnc: 114 mg/dL
Hgb A1c MFr Bld: 5.6 % (ref 4.8–5.6)

## 2022-05-05 ENCOUNTER — Ambulatory Visit: Payer: Self-pay | Admitting: Internal Medicine

## 2022-05-05 ENCOUNTER — Encounter: Payer: Self-pay | Admitting: Internal Medicine

## 2022-05-05 VITALS — BP 118/70 | HR 72 | Resp 12 | Ht 59.75 in | Wt 157.0 lb

## 2022-05-05 DIAGNOSIS — Z23 Encounter for immunization: Secondary | ICD-10-CM

## 2022-05-05 DIAGNOSIS — E785 Hyperlipidemia, unspecified: Secondary | ICD-10-CM | POA: Insufficient documentation

## 2022-05-05 DIAGNOSIS — E7800 Pure hypercholesterolemia, unspecified: Secondary | ICD-10-CM

## 2022-05-05 DIAGNOSIS — E039 Hypothyroidism, unspecified: Secondary | ICD-10-CM

## 2022-05-05 DIAGNOSIS — R350 Frequency of micturition: Secondary | ICD-10-CM

## 2022-05-05 DIAGNOSIS — Z1231 Encounter for screening mammogram for malignant neoplasm of breast: Secondary | ICD-10-CM

## 2022-05-05 DIAGNOSIS — R7401 Elevation of levels of liver transaminase levels: Secondary | ICD-10-CM | POA: Insufficient documentation

## 2022-05-05 DIAGNOSIS — R7303 Prediabetes: Secondary | ICD-10-CM | POA: Insufficient documentation

## 2022-05-05 DIAGNOSIS — Z0000 Encounter for general adult medical examination without abnormal findings: Secondary | ICD-10-CM

## 2022-05-05 LAB — POCT URINALYSIS DIPSTICK
Bilirubin, UA: NEGATIVE
Glucose, UA: NEGATIVE
Leukocytes, UA: NEGATIVE
Nitrite, UA: NEGATIVE
Protein, UA: NEGATIVE
Spec Grav, UA: 1.025 (ref 1.010–1.025)
Urobilinogen, UA: 0.2 E.U./dL
pH, UA: 6 (ref 5.0–8.0)

## 2022-05-05 MED ORDER — LEVOTHYROXINE SODIUM 50 MCG PO TABS: 50.0000 ug | ORAL_TABLET | Freq: Every day | ORAL | 3 refills | Status: DC

## 2022-05-05 NOTE — Progress Notes (Signed)
Subjective:    Patient ID: Sonya Gregory, female   DOB: 1975-10-24, 47 y.o.   MRN: [id number]   HPI  Tereasa Coop interprets  CPE with pap  1.  Pap:   Normal pap 03/2021.  Never with abnormal pap  2.  Mammogram:  Last 07/2018 and normal.    Paternal great aunt with history of breast cancer at age 25.    3.  Osteoprevention:  She does not eat or drink much in way of milk products.  Willing to drink milk or eat cheese 3 times daily.  Walks, but not daily.  Willing to work on this for 30 minutes daily.    4.  Guaiac Cards/FIT:  Never.    5.  Colonoscopy:  Never.  No family history of colon cancer.    6.  Immunizations:  Needs 3rd Hepatitis B.  No COVID vaccines.  Has not wanted the latter. Immunization History  Administered Date(s) Administered   Hepatitis B, ADULT 09/08/2021, 11/10/2021   Influenza Inj Mdck Quad Pf 11/26/2020   Influenza,inj,Quad PF,6+ Mos 11/10/2021   Td 01/16/2001   Tdap 11/26/2020     7.  Glucose/Cholesterol:  A1C now in normal range at 5.6%.    8.  Hypothyroidism:  Has been taking a total of 50 mcg daily.  Mix up earlier with her levothyroxine.  TSH has been stable in normal range.    Cholesterol was fine last spring  States she stopped drinking sodas. Eating more vegetables.  Lipid Panel     Component Value Date/Time   CHOL 199 03/18/2021 0952   TRIG 94 03/18/2021 0952   TRIG 78 04/07/2016 1005   HDL 53 03/18/2021 0952   CHOLHDL 3.3 04/07/2016 1005   LDLCALC 129 (H) 03/18/2021 0952   LABVLDL 17 03/18/2021 0952     Current Meds  Medication Sig   levothyroxine (SYNTHROID) 25 MCG tablet TAKE 2 TABLETS(50 MCG) BY MOUTH EVERY MORNING   No Known Allergies  Past Medical History:  Diagnosis Date   Hyperlipidemia    Hypothyroidism (acquired) 02/16/2018   Iritis 12/2011   dx per ophtalmology   Nephrolithiasis 12/16/2019   left   Prediabetes 2023   Past Surgical History:  Procedure Laterality Date   CESAREAN SECTION     Family  History  Problem Relation Age of Onset   Arthritis/Rheumatoid Mother    Diabetes Father    Obesity Brother    Hypertension Paternal Grandmother    Colon cancer Neg Hx    CAD Neg Hx    Social History   Socioeconomic History   Marital status: Married    Spouse name: Eloy   Number of children: 1   Years of education: Not on file   Highest education level: Bachelor's degree (e.g., BA, AB, BS)  Occupational History   Occupation: Housecleaning  Tobacco Use   Smoking status: Never    Passive exposure: Never   Smokeless tobacco: Never  Vaping Use   Vaping Use: Never used  Substance and Sexual Activity   Alcohol use: No   Drug use: No   Sexual activity: Yes    Birth control/protection: None  Other Topics Concern   Not on file  Social History Narrative   Originally from [place]   Degree in Early Childhood education   Lives at home with husband and daughter.   Social Determinants of Health   Financial Resource Strain: Low Risk  (05/05/2022)   Overall Financial Resource Strain (CARDIA)  Difficulty of Paying Living Expenses: Not very hard  Food Insecurity: No Food Insecurity (05/05/2022)   Hunger Vital Sign    Worried About Running Out of Food in the Last Year: Never true    Ran Out of Food in the Last Year: Never true  Transportation Needs: No Transportation Needs (05/05/2022)   PRAPARE - Administrator, Civil Service (Medical): No    Lack of Transportation (Non-Medical): No  Physical Activity: Not on file  Stress: Not on file  Social Connections: Not on file  Intimate Partner Violence: Not At Risk (05/05/2022)   Humiliation, Afraid, Rape, and Kick questionnaire    Fear of Current or Ex-Partner: No    Emotionally Abused: No    Physically Abused: No    Sexually Abused: No      Review of Systems  Eyes:  Positive for visual disturbance (Needing reading glasses now to read--does correct vision.).  Genitourinary:  Positive for frequency (urine also  with odor.  Not clear if urine more concentrated.  symptoms for 1 month). Negative for dysuria.      Objective:   BP 118/70 (BP Location: Right Arm, Patient Position: Sitting, Cuff Size: Normal)   Pulse 72   Resp 12   Ht 4' 11.75" (1.518 m)   Wt 157 lb (71.2 kg)   LMP 03/07/2022 (Exact Date)   BMI 30.92 kg/m   Physical Exam Constitutional:      Appearance: She is obese.  HENT:     Head: Normocephalic and atraumatic.     Right Ear: Tympanic membrane, ear canal and external ear normal.     Left Ear: Tympanic membrane, ear canal and external ear normal.     Nose: Nose normal.     Mouth/Throat:     Mouth: Mucous membranes are moist.     Pharynx: Oropharynx is clear.  Eyes:     Extraocular Movements: Extraocular movements intact.     Conjunctiva/sclera: Conjunctivae normal.     Pupils: Pupils are equal, round, and reactive to light.     Comments: Discs sharp  Neck:     Thyroid: No thyroid mass or thyromegaly.  Cardiovascular:     Rate and Rhythm: Normal rate and regular rhythm.     Heart sounds: S1 normal and S2 normal. No murmur heard.    No friction rub. No S3 or S4 sounds.     Comments: No carotid bruits.  Carotid, radial, femoral, DP and PT pulses normal and equal.   Pulmonary:     Effort: Pulmonary effort is normal.     Breath sounds: Normal breath sounds and air entry.  Chest:  Breasts:    Right: No inverted nipple, mass or nipple discharge.     Left: No inverted nipple, mass or nipple discharge.  Abdominal:     General: Bowel sounds are normal.     Palpations: Abdomen is soft. There is no hepatomegaly, splenomegaly or mass.     Tenderness: There is no abdominal tenderness.     Hernia: No hernia is present.     Comments: Low transverse surgical scar  Genitourinary:    Comments: Normal external female genitalia No uterine or adnexal mass.   Perhaps some mild right suprapubic tenderness. Musculoskeletal:        General: Normal range of motion.     Cervical  back: Normal range of motion and neck supple.     Right lower leg: No edema.     Left lower leg: No edema.  Lymphadenopathy:     Head:     Right side of head: No submental or submandibular adenopathy.     Left side of head: No submental or submandibular adenopathy.     Cervical: No cervical adenopathy.     Upper Body:     Right upper body: No supraclavicular or axillary adenopathy.     Left upper body: No supraclavicular or axillary adenopathy.     Lower Body: No right inguinal adenopathy. No left inguinal adenopathy.  Skin:    General: Skin is warm.     Capillary Refill: Capillary refill takes less than 2 seconds.  Neurological:     General: No focal deficit present.     Mental Status: She is alert and oriented to person, place, and time.     Cranial Nerves: Cranial nerves 2-12 are intact.     Sensory: Sensation is intact.     Motor: Motor function is intact.     Coordination: Coordination is intact.     Gait: Gait is intact.     Deep Tendon Reflexes: Reflexes are normal and symmetric.  Psychiatric:        Speech: Speech normal.        Behavior: Behavior normal. Behavior is cooperative.      Assessment & Plan   CPE without pap Hep B vaccine #3/3 Refused COVID Mammogram  2.  Prediabetes:  A1C now in normal range  3.  Elevated ALT:  much improved with lifestyle changes.  4.  Mild hypercholesterolemia:  was okay last year.  5.  Hypothyroidism:  TSH stable in normal range.  Fill Levo thyroxine for 1 year.  6.  Urinary frequency and odor:  UA.  7.  Decreased visual acuity:  encouraged going to optometrist.  Discussed many people need reading glasses in 40s

## 2022-05-07 LAB — URINE CULTURE

## 2022-05-17 ENCOUNTER — Telehealth: Payer: Self-pay

## 2022-05-17 NOTE — Telephone Encounter (Signed)
Telephoned patient at mobile number using interpreter# [phone]. Left a voice message with [facility] (scholarship) contact information.

## 2022-05-25 ENCOUNTER — Other Ambulatory Visit: Payer: Self-pay | Admitting: Internal Medicine

## 2022-07-07 ENCOUNTER — Other Ambulatory Visit: Payer: Self-pay

## 2022-07-07 DIAGNOSIS — B3731 Acute candidiasis of vulva and vagina: Secondary | ICD-10-CM

## 2022-07-07 DIAGNOSIS — R300 Dysuria: Secondary | ICD-10-CM

## 2022-07-07 LAB — POCT WET PREP WITH KOH
KOH Prep POC: NEGATIVE
RBC Wet Prep HPF POC: NEGATIVE
Trichomonas, UA: NEGATIVE

## 2022-07-07 LAB — POCT URINALYSIS DIPSTICK
Bilirubin, UA: NEGATIVE
Glucose, UA: NEGATIVE
Ketones, UA: NEGATIVE
Leukocytes, UA: NEGATIVE
Nitrite, UA: NEGATIVE
Protein, UA: NEGATIVE
Spec Grav, UA: 1.025 (ref 1.010–1.025)
Urobilinogen, UA: 0.2 E.U./dL
pH, UA: 6.5 (ref 5.0–8.0)

## 2022-07-07 MED ORDER — FLUCONAZOLE 150 MG PO TABS
ORAL_TABLET | ORAL | 0 refills | Status: DC
Start: 2022-07-07 — End: 2022-07-11

## 2022-07-07 NOTE — Progress Notes (Signed)
Patient describes urinary urgency, itching, some burning and odor in genital area. Complaint started 2 weeks ago. Has not taken any medication for this complaint.

## 2022-07-07 NOTE — Progress Notes (Signed)
No discharge or vaginal bleeding.  No odor. Wet prep shows budding yeast and + clue cells with + whiff.  Yeast appears much more prominent. Treat for yeast vaginitis and to call if symptoms do not resolve and will treat for BV as well.

## 2022-07-11 ENCOUNTER — Telehealth: Payer: Self-pay

## 2022-07-11 MED ORDER — FLUCONAZOLE 150 MG PO TABS: ORAL_TABLET | ORAL | 0 refills | Status: DC

## 2022-07-11 NOTE — Telephone Encounter (Signed)
Fluconazole 150mg  tab for 1 day was sent to the pharmacy. Patient has been notified of Rx and instructed to call us if symptoms do not resolve.

## 2022-07-11 NOTE — Telephone Encounter (Signed)
Patient called to report that she finished the fluconazole, but symptoms have not entirely gone away. Patient no longer has dysuria, urinary urgency, and decreased odor. Itching has decreased, but has not gone away entirely. Would like some recommendations for the continued itching.

## 2022-12-07 ENCOUNTER — Ambulatory Visit
Admission: RE | Admit: 2022-12-07 | Discharge: 2022-12-07 | Disposition: A | Discharge status: Home / Self Care | Payer: No Typology Code available for payment source | Source: Ambulatory Visit | Attending: Internal Medicine | Admitting: Internal Medicine

## 2022-12-07 DIAGNOSIS — Z1231 Encounter for screening mammogram for malignant neoplasm of breast: Secondary | ICD-10-CM

## 2023-05-10 ENCOUNTER — Other Ambulatory Visit: Payer: Self-pay | Admitting: Internal Medicine

## 2023-05-11 ENCOUNTER — Other Ambulatory Visit: Payer: Self-pay

## 2023-05-11 DIAGNOSIS — E039 Hypothyroidism, unspecified: Secondary | ICD-10-CM

## 2023-05-11 DIAGNOSIS — R7401 Elevation of levels of liver transaminase levels: Secondary | ICD-10-CM

## 2023-05-11 DIAGNOSIS — R7303 Prediabetes: Secondary | ICD-10-CM

## 2023-05-11 DIAGNOSIS — Z0000 Encounter for general adult medical examination without abnormal findings: Secondary | ICD-10-CM

## 2023-05-12 LAB — CBC WITH DIFFERENTIAL/PLATELET
Basophils Absolute: 0 10*3/uL (ref 0.0–0.2)
Basos: 0 %
EOS (ABSOLUTE): 0.2 10*3/uL (ref 0.0–0.4)
Eos: 4 %
Hematocrit: 43.5 % (ref 34.0–46.6)
Hemoglobin: 13.7 g/dL (ref 11.1–15.9)
Immature Grans (Abs): 0 10*3/uL (ref 0.0–0.1)
Immature Granulocytes: 0 %
Lymphocytes Absolute: 1.9 10*3/uL (ref 0.7–3.1)
Lymphs: 33 %
MCH: 29.4 pg (ref 26.6–33.0)
MCHC: 31.5 g/dL (ref 31.5–35.7)
MCV: 93 fL (ref 79–97)
Monocytes Absolute: 0.3 10*3/uL (ref 0.1–0.9)
Monocytes: 6 %
Neutrophils Absolute: 3.2 10*3/uL (ref 1.4–7.0)
Neutrophils: 57 %
Platelets: 224 10*3/uL (ref 150–450)
RBC: 4.66 x10E6/uL (ref 3.77–5.28)
RDW: 13.5 % (ref 11.7–15.4)
WBC: 5.7 10*3/uL (ref 3.4–10.8)

## 2023-05-12 LAB — COMPREHENSIVE METABOLIC PANEL WITH GFR
ALT: 175 IU/L — ABNORMAL HIGH (ref 0–32)
AST: 88 IU/L — ABNORMAL HIGH (ref 0–40)
Albumin: 4.3 g/dL (ref 3.9–4.9)
Alkaline Phosphatase: 117 IU/L (ref 44–121)
BUN/Creatinine Ratio: 25 — ABNORMAL HIGH (ref 9–23)
BUN: 17 mg/dL (ref 6–24)
Bilirubin Total: 1.1 mg/dL (ref 0.0–1.2)
CO2: 22 mmol/L (ref 20–29)
Calcium: 9.1 mg/dL (ref 8.7–10.2)
Chloride: 103 mmol/L (ref 96–106)
Creatinine, Ser: 0.68 mg/dL (ref 0.57–1.00)
Globulin, Total: 3.2 g/dL (ref 1.5–4.5)
Glucose: 96 mg/dL (ref 70–99)
Potassium: 4.2 mmol/L (ref 3.5–5.2)
Sodium: 138 mmol/L (ref 134–144)
Total Protein: 7.5 g/dL (ref 6.0–8.5)
eGFR: 108 mL/min/{1.73_m2} (ref >59)

## 2023-05-12 LAB — HEMOGLOBIN A1C
Est. average glucose Bld gHb Est-mCnc: 126 mg/dL
Hgb A1c MFr Bld: 6 % — ABNORMAL HIGH (ref 4.8–5.6)

## 2023-05-12 LAB — LIPID PANEL W/O CHOL/HDL RATIO
Cholesterol, Total: 216 mg/dL — ABNORMAL HIGH (ref 100–199)
HDL: 49 mg/dL (ref >39)
LDL Chol Calc (NIH): 145 mg/dL — ABNORMAL HIGH (ref 0–99)
Triglycerides: 121 mg/dL (ref 0–149)
VLDL Cholesterol Cal: 22 mg/dL (ref 5–40)

## 2023-05-12 LAB — TSH: TSH: 13.6 u[IU]/mL — ABNORMAL HIGH (ref 0.450–4.500)

## 2023-05-23 ENCOUNTER — Encounter: Payer: Self-pay | Admitting: Internal Medicine

## 2023-05-23 ENCOUNTER — Ambulatory Visit: Payer: Self-pay | Admitting: Internal Medicine

## 2023-05-23 VITALS — BP 118/84 | HR 84 | Resp 16 | Ht 59.75 in | Wt 170.0 lb

## 2023-05-23 DIAGNOSIS — E7800 Pure hypercholesterolemia, unspecified: Secondary | ICD-10-CM

## 2023-05-23 DIAGNOSIS — R7303 Prediabetes: Secondary | ICD-10-CM

## 2023-05-23 DIAGNOSIS — Z0000 Encounter for general adult medical examination without abnormal findings: Secondary | ICD-10-CM

## 2023-05-23 NOTE — Patient Instructions (Signed)
Stop Magnesium. 

## 2023-05-23 NOTE — Progress Notes (Signed)
 Subjective:    Patient ID: Sonya Gregory, female   DOB: 1975-10-13, 48 y.o.   MRN: [id number]   HPI  Sonya Gregory interprets  CPE without pap  1.  Pap:  Last 03/2021 and normal.    2.  Mammogram:  Last 11/2022 and normal.  No family history of breast cancer.    3.  Osteoprevention:  States she takes a Vitamin D and calcium supplement, but not aware of dosing.  Drinks one serving of milk daily. Walks 4 times weekly for 30 minutes outside.    4.  Guaiac Cards/FIT:  Did not return in past.  5.  Colonoscopy:  Never.  No family history of colon cancer.    6.  Immunizations:  Has not had any COVID vaccines.  Feels it made family members ill--one in another country with the single vaccine and clotting issues.  Husband with illness that lasted one month.  He was never seen by medical personnel to clarify issue. Immunization History  Administered Date(s) Administered   Hepatitis B, ADULT 09/08/2021, 11/10/2021, 05/05/2022   Influenza Inj Mdck Quad Pf 11/26/2020   Influenza,inj,Quad PF,6+ Mos 11/10/2021   Td 01/16/2001   Tdap 11/26/2020       7.  Glucose/Cholesterol:  A1C back up to higher level at 6.0%.  Was able to get to normal range last year this time. Her cholesterol is up as well.   Her TSH is up as well, which may be part of metabolic issue. She is taking magnesium at night, which she uses for sleep.  Dosing is well before the 4 hour dosing window from levothyroxine  as can affect absorption.  Lipid Panel     Component Value Date/Time   CHOL 216 (H) 05/11/2023 0908   TRIG 121 05/11/2023 0908   TRIG 78 04/07/2016 1005   HDL 49 05/11/2023 0908   CHOLHDL 3.3 04/07/2016 1005   LDLCALC 145 (H) 05/11/2023 0908   LABVLDL 22 05/11/2023 0908   8.  Other:  Transaminases back up, as is her cholesterol.  Viral hepatitis testing negative in past.   Current Meds  Medication Sig   levothyroxine  (SYNTHROID ) 50 MCG tablet TAKE 1 TABLET BY MOUTH DAILY   No Known  Allergies  Past Medical History:  Diagnosis Date   Hyperlipidemia    Hypothyroidism (acquired) 02/16/2018   Iritis 12/2011   dx per ophtalmology   Nephrolithiasis 12/16/2019   left   Prediabetes 2023   Past Surgical History:  Procedure Laterality Date   CESAREAN SECTION     Family History  Problem Relation Age of Onset   Arthritis/Rheumatoid Mother    Diabetes Father    Obesity Brother    GI problems Daughter    Hypertension Paternal Grandmother    Colon cancer Neg Hx    CAD Neg Hx    Social History   Socioeconomic History   Marital status: Married    Spouse name: Eloy   Number of children: 1   Years of education: Not on file   Highest education level: Bachelor's degree (e.g., BA, AB, BS)  Occupational History   Occupation: Housecleaning  Tobacco Use   Smoking status: Never    Passive exposure: Never   Smokeless tobacco: Never  Vaping Use   Vaping status: Never Used  Substance and Sexual Activity   Alcohol use: No   Drug use: No   Sexual activity: Not Currently    Birth control/protection: None  Other Topics Concern   Not on  file  Social History Narrative   Originally from [place] in [place]   Lives at home with husband and daughter.   Social Drivers of Health   Financial Resource Strain: Medium Risk (05/23/2023)   Overall Financial Resource Strain (CARDIA)    Difficulty of Paying Living Expenses: Somewhat hard  Food Insecurity: Food Insecurity Present (05/23/2023)   Hunger Vital Sign    Worried About Running Out of Food in the Last Year: Sometimes true    Ran Out of Food in the Last Year: Sometimes true  Transportation Needs: No Transportation Needs (05/23/2023)   PRAPARE - Administrator, Civil Service (Medical): No    Lack of Transportation (Non-Medical): No  Physical Activity: Not on file  Stress: Not on file  Social Connections: Not on file  Intimate Partner Violence: Not At Risk (05/23/2023)    Humiliation, Afraid, Rape, and Kick questionnaire    Fear of Current or Ex-Partner: No    Emotionally Abused: No    Physically Abused: No    Sexually Abused: No      Review of Systems  HENT:  Negative for dental problem (Does not get dental care.).   Eyes:  Negative for visual disturbance.  Respiratory:  Negative for shortness of breath.   Cardiovascular:  Negative for chest pain, palpitations and leg swelling.  Genitourinary:        No period since December.   No significant issue with hot flashes and night sweats.       Objective:   BP 118/84 (BP Location: Right Arm, Patient Position: Sitting, Cuff Size: Normal)   Pulse 84   Resp 16   Ht 4' 11.75" (1.518 m)   Wt 170 lb (77.1 kg)   LMP 01/02/2023   SpO2 98%   BMI 33.48 kg/m   Physical Exam Constitutional:      Appearance: She is obese.  HENT:     Head: Normocephalic and atraumatic.     Right Ear: Tympanic membrane, ear canal and external ear normal.     Left Ear: Tympanic membrane, ear canal and external ear normal.     Nose: Nose normal.     Mouth/Throat:     Mouth: Mucous membranes are moist.     Pharynx: Oropharynx is clear.  Eyes:     Extraocular Movements: Extraocular movements intact.     Conjunctiva/sclera: Conjunctivae normal.     Pupils: Pupils are equal, round, and reactive to light.     Comments: Discs sharp  Neck:     Thyroid: No thyroid mass or thyromegaly.  Cardiovascular:     Rate and Rhythm: Normal rate and regular rhythm.     Heart sounds: S1 normal and S2 normal. No murmur heard.    No friction rub. No S3 or S4 sounds.     Comments: No carotid bruits.  Carotid, radial, femoral, DP and PT pulses normal and equal.   Pulmonary:     Effort: Pulmonary effort is normal.     Breath sounds: Normal breath sounds and air entry.  Chest:  Breasts:    Right: No inverted nipple, mass or nipple discharge.     Left: No inverted nipple, mass or nipple discharge.  Abdominal:     General: Bowel sounds  are normal.     Palpations: Abdomen is soft. There is no hepatomegaly, splenomegaly or mass.     Hernia: No hernia is present.  Genitourinary:    Comments: Normal external female genitalia  Much difficulty relaxing perineal and abdominal musculature for exam No uterine or adnexal mass or tenderness Musculoskeletal:        General: Normal range of motion.     Cervical back: Normal range of motion and neck supple.     Right lower leg: No edema.     Left lower leg: No edema.  Feet:     Right foot:     Skin integrity: Skin integrity normal.     Left foot:     Skin integrity: Skin integrity normal.  Lymphadenopathy:     Head:     Right side of head: No submental or submandibular adenopathy.     Left side of head: No submental or submandibular adenopathy.     Cervical: No cervical adenopathy.     Upper Body:     Right upper body: No supraclavicular or axillary adenopathy.     Left upper body: No supraclavicular or axillary adenopathy.     Lower Body: No right inguinal adenopathy. No left inguinal adenopathy.  Skin:    General: Skin is warm.     Capillary Refill: Capillary refill takes less than 2 seconds.     Findings: No rash.  Neurological:     General: No focal deficit present.     Mental Status: She is alert and oriented to person, place, and time.     Cranial Nerves: Cranial nerves 2-12 are intact.     Sensory: Sensation is intact.     Motor: Motor function is intact.     Coordination: Coordination is intact.     Gait: Gait is intact.     Deep Tendon Reflexes: Reflexes are normal and symmetric.  Psychiatric:        Mood and Affect: Mood normal.        Behavior: Behavior normal. Behavior is cooperative.      Assessment & Plan   CPE without pap Mammogram in 2026 FIT to return in 2 week. Declines COVID vaccination  2.  Elevated transaminases:  back up.  If not down with cholesterol and A1C in 4 months, will send for abdominal US .  Suspect fatty liver.    3.   Prediabetes:  Control worse again also with 13 lb weight gain.  Possibly due to inadequate thyroid hormone--reassess after TSH normalized.  4.  Hypercholesterolemia:  as in #3.    5.  Hypothyroidism:  she will stop the Mg supplement she has been taking the last 2 months.  Will get back in for TSH in 6 weeks.   6.  Needs orange card so can have regular eye check with history of iritis 12 years ago.

## 2023-07-04 ENCOUNTER — Other Ambulatory Visit: Payer: Self-pay

## 2023-07-04 DIAGNOSIS — E039 Hypothyroidism, unspecified: Secondary | ICD-10-CM

## 2023-07-05 LAB — TSH: TSH: 7.68 u[IU]/mL — ABNORMAL HIGH (ref 0.450–4.500)

## 2023-07-08 ENCOUNTER — Ambulatory Visit: Payer: Self-pay | Admitting: Internal Medicine

## 2023-07-08 MED ORDER — LEVOTHYROXINE SODIUM 75 MCG PO TABS
75.0000 ug | ORAL_TABLET | Freq: Every day | ORAL | 11 refills | Status: AC
Start: 2023-07-08 — End: ?

## 2023-08-24 ENCOUNTER — Other Ambulatory Visit: Payer: Self-pay

## 2023-08-24 DIAGNOSIS — E039 Hypothyroidism, unspecified: Secondary | ICD-10-CM

## 2023-08-25 LAB — TSH: TSH: 3.1 u[IU]/mL (ref 0.450–4.500)

## 2023-09-25 ENCOUNTER — Other Ambulatory Visit: Payer: Self-pay

## 2023-09-25 DIAGNOSIS — R7401 Elevation of levels of liver transaminase levels: Secondary | ICD-10-CM

## 2023-09-25 DIAGNOSIS — E039 Hypothyroidism, unspecified: Secondary | ICD-10-CM

## 2023-09-25 DIAGNOSIS — R7303 Prediabetes: Secondary | ICD-10-CM

## 2023-09-26 LAB — LIPID PANEL
Chol/HDL Ratio: 4.5 ratio — ABNORMAL HIGH (ref 0.0–4.4)
Cholesterol, Total: 197 mg/dL (ref 100–199)
HDL: 44 mg/dL (ref >39)
LDL Chol Calc (NIH): 130 mg/dL — ABNORMAL HIGH (ref 0–99)
Triglycerides: 129 mg/dL (ref 0–149)
VLDL Cholesterol Cal: 23 mg/dL (ref 5–40)

## 2023-09-26 LAB — COMPREHENSIVE METABOLIC PANEL WITH GFR
ALT: 80 IU/L — ABNORMAL HIGH (ref 0–32)
AST: 42 IU/L — ABNORMAL HIGH (ref 0–40)
Albumin: 4.4 g/dL (ref 3.9–4.9)
Alkaline Phosphatase: 114 IU/L (ref 44–121)
BUN/Creatinine Ratio: 28 — ABNORMAL HIGH (ref 9–23)
BUN: 18 mg/dL (ref 6–24)
Bilirubin Total: 1 mg/dL (ref 0.0–1.2)
CO2: 20 mmol/L (ref 20–29)
Calcium: 9.2 mg/dL (ref 8.7–10.2)
Chloride: 103 mmol/L (ref 96–106)
Creatinine, Ser: 0.64 mg/dL (ref 0.57–1.00)
Globulin, Total: 3.3 g/dL (ref 1.5–4.5)
Glucose: 97 mg/dL (ref 70–99)
Potassium: 4 mmol/L (ref 3.5–5.2)
Sodium: 138 mmol/L (ref 134–144)
Total Protein: 7.7 g/dL (ref 6.0–8.5)
eGFR: 109 mL/min/1.73 (ref >59)

## 2023-09-26 LAB — HEMOGLOBIN A1C
Est. average glucose Bld gHb Est-mCnc: 114 mg/dL
Hgb A1c MFr Bld: 5.6 % (ref 4.8–5.6)

## 2023-09-26 LAB — TSH: TSH: 2.59 u[IU]/mL (ref 0.450–4.500)

## 2023-10-16 ENCOUNTER — Ambulatory Visit: Payer: Self-pay | Admitting: Internal Medicine

## 2023-11-18 ENCOUNTER — Ambulatory Visit: Payer: Self-pay | Admitting: Internal Medicine

## 2024-05-23 ENCOUNTER — Other Ambulatory Visit: Payer: Self-pay

## 2024-05-27 ENCOUNTER — Encounter: Payer: Self-pay | Admitting: Internal Medicine
